# Patient Record
Sex: Female | Born: 1980 | Race: White | Hispanic: No | Marital: Married | Smoking: Never smoker
Health system: Southern US, Community
[De-identification: ages and names within clinical notes are randomized; demographics above are authoritative.]

## PROBLEM LIST (undated history)

## (undated) DIAGNOSIS — S92901A Unspecified fracture of right foot, initial encounter for closed fracture: Secondary | ICD-10-CM

## (undated) DIAGNOSIS — K5190 Ulcerative colitis, unspecified, without complications: Secondary | ICD-10-CM

## (undated) DIAGNOSIS — Z3493 Encounter for supervision of normal pregnancy, unspecified, third trimester: Secondary | ICD-10-CM

## (undated) DIAGNOSIS — D509 Iron deficiency anemia, unspecified: Secondary | ICD-10-CM

## (undated) DIAGNOSIS — E162 Hypoglycemia, unspecified: Secondary | ICD-10-CM

## (undated) HISTORY — PX: HERNIA REPAIR: SHX51

## (undated) HISTORY — DX: Iron deficiency anemia, unspecified: D50.9

---

## 2005-02-27 HISTORY — PX: ECTOPIC PREGNANCY SURGERY: SHX613

## 2013-06-04 ENCOUNTER — Encounter: Payer: Self-pay | Admitting: Obstetrics & Gynecology

## 2013-10-21 ENCOUNTER — Inpatient Hospital Stay: Payer: Self-pay

## 2013-10-21 LAB — CBC WITH DIFFERENTIAL/PLATELET
Basophil #: 0.1 10*3/uL (ref 0.0–0.1)
Basophil %: 0.5 %
Eosinophil #: 0 10*3/uL (ref 0.0–0.7)
Eosinophil %: 0.3 %
HCT: 35.6 % (ref 35.0–47.0)
HGB: 12 g/dL (ref 12.0–16.0)
Lymphocyte #: 1.7 10*3/uL (ref 1.0–3.6)
Lymphocyte %: 13.3 %
MCH: 30.7 pg (ref 26.0–34.0)
MCHC: 33.8 g/dL (ref 32.0–36.0)
MCV: 91 fL (ref 80–100)
MONOS PCT: 7.1 %
Monocyte #: 0.9 x10 3/mm (ref 0.2–0.9)
NEUTROS PCT: 78.8 %
Neutrophil #: 10.1 10*3/uL — ABNORMAL HIGH (ref 1.4–6.5)
Platelet: 202 10*3/uL (ref 150–440)
RBC: 3.91 10*6/uL (ref 3.80–5.20)
RDW: 13.5 % (ref 11.5–14.5)
WBC: 12.8 10*3/uL — AB (ref 3.6–11.0)

## 2013-10-22 LAB — HEMATOCRIT: HCT: 34 % — ABNORMAL LOW (ref 35.0–47.0)

## 2014-07-30 NOTE — L&D Delivery Note (Signed)
Obstetrical Delivery Note   Date of Delivery:   06/21/2015 Primary OB:   [facility] Gestational Age/EDD: 4459w6d  Antepartum complications: Too weak to titer Anti-E (capitalized)  Delivered By:   [place] Bingharlie Teylor Wolven, Montez HagemanJr MD  Delivery Type:   spontaneous vaginal delivery  Delivery Details:   CTSP and BBOW at +3 along with fetal head. With next UC, bag ruptured for clear fluid and patient easily delivered OA infant. Delayed cord clamping and cut by father. Anesthesia:    local Intrapartum complications: precipitous delivery GBS:    Negative Laceration:    1st degree repaired Episiotomy:    none Placenta:    Delivered and expressed via active management. Intact: yes. To pathology: no.  Estimated Blood Loss:  200mL  Baby:    Liveborn female, APGARs 8/9, weight 3370gm  Cornelia Copaharlie Terell Kincy, Jr. MD [facility] Pager [phone]

## 2014-11-19 NOTE — Consult Note (Signed)
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  Pt declines ever having a blood transfusion. All of her pregnancies have been by her husband.  Her first pregnancy was complicated by a heterotopic pregnancy which required a lapascopic removal of "pelvic' mass which was thought to be an ectopic. She is not sure if the pathology revealed pregnancy tissue. She reports having a negative antibody screen in her subsequent pregnancy.  During her prental screen, she was found to be sensitized to E with a titer of 1:8.  Repeat titers approximately 8 weeks later were down to 1:2. She had not had a prior affected pregnancy and is not aware of ever having a positive antibody screen.   Past Obstetrical Hx G3 P2002 2007: Possible heterotopic pregnancy. Found to have a "pelvic mass" early in pregnancy that was removed laparscopically. Pt thinks was an ectopic but is unsure what the pathology showed. She went on to have an uncomplicated prenatal course, going into labor spontaneously at 40 weeks, female infant weighing 6 pounds 11 ounces. No delivery complications 2013: Induced at 41 weeks, spontaneous vaginal delivery of 7 pound 11 ounce baby. No comlications. No antenatal bleeding   Home Medications: Medication Instructions Status  multivitamin 1   once a day Active   Allergies:   No Known Allergies:   Vital Signs/Notes:  Nursing Vital Signs: **Vital Signs.:   06-Nov-14 09:49  Pulse Pulse 72  Systolic BP Systolic BP 87  Diastolic BP (mmHg) Diastolic BP (mmHg) 62    09:54  Systolic BP Systolic BP 100  Diastolic BP (mmHg) Diastolic BP (mmHg) 65   Perinatal Consult:  PGyn Hx Denies history of abnormal paps or STDs   Past Medical History cont'd Denies history of transfusion, HTN, diabetes or  thyroid disorders   PSurg Hx Laparscopic "pelvic mass" removal in pregnancy, thought to be a heterotopic.  Umbilical hernia repair.   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 No abdominal pain. No vaginal bleeding.   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  Currently her titers have decreased from 1:8 to 1:2.   Recommendations 1. Isoimmunization to E.  We discussed the potential implications to the pregnancy being sensitized to E.  Anti-E can lead to hemolytic disease of the newborn. She denies ever having a prior positive antibody screen and denies having had a prior affected infant. All pregnancies have been with her husband. We discussed potential risk to the pregnancy, should her antibody level increases.  If her antibody titer levels increase to  1:16 or greater, we discussed fetal surveillance protocols to screen for fetal anemia and potential complications associated with PUBS and inuterine transfusion.  Recs: -Check antibody titers monthly for the entire pregnancy. If the titers get to 1:16 or higher, please send her back so we can test the father of the baby for carriage of the E antigen and then begin weekly MCA doppler  surveillance for fetal anemia. -At time of delivery, allow adequate time for her type and screen to be processed as the blood bank will need to find E-negative compatible blood.    Total Time Spent with Patient 60 minutes   >50% of visit spent in couseling/coordination of care yes   Office Use Only 99244  Level 4 (60min) NEW office consult low complexity   Coding Description: MATERNAL CONDITIONS/HISTORY INDICATION(S).   Isoimmunization, Non-RH (Anti E, Keli, M, S).  Electronic Signatures: Adriyana Greenbaum, [place] (MD)  (Signed [id number]-Nov-14 16:32)  Authored: Referral, Home Medications, Allergies, Vital Signs/Notes, Consult, Exam, Lab/Radiology Notes, Impression, Billing, Coding Description   Last Updated: 06-Nov-14 16:32 by Starla Deller, [place] (MD)

## 2015-06-21 ENCOUNTER — Inpatient Hospital Stay
Admission: RE | Admit: 2015-06-21 | Discharge: 2015-06-22 | DRG: 775 | Disposition: A | Discharge status: Home / Self Care | Payer: BLUE CROSS/BLUE SHIELD | Source: Ambulatory Visit | Attending: Obstetrics and Gynecology | Admitting: Obstetrics and Gynecology

## 2015-06-21 ENCOUNTER — Encounter: Payer: Self-pay | Admitting: *Deleted

## 2015-06-21 DIAGNOSIS — Z2821 Immunization not carried out because of patient refusal: Secondary | ICD-10-CM | POA: Diagnosis not present

## 2015-06-21 DIAGNOSIS — Z3483 Encounter for supervision of other normal pregnancy, third trimester: Secondary | ICD-10-CM | POA: Diagnosis present

## 2015-06-21 DIAGNOSIS — Z3A39 39 weeks gestation of pregnancy: Secondary | ICD-10-CM

## 2015-06-21 DIAGNOSIS — O0990 Supervision of high risk pregnancy, unspecified, unspecified trimester: Secondary | ICD-10-CM

## 2015-06-21 LAB — TYPE AND SCREEN
ABO/RH(D): O POS
ANTIBODY SCREEN: POSITIVE
PT AG TYPE: POSITIVE

## 2015-06-21 LAB — CBC
HCT: 38.5 % (ref 35.0–47.0)
HEMOGLOBIN: 12.6 g/dL (ref 12.0–16.0)
MCH: 29.4 pg (ref 26.0–34.0)
MCHC: 32.7 g/dL (ref 32.0–36.0)
MCV: 89.7 fL (ref 80.0–100.0)
Platelets: 231 10*3/uL (ref 150–440)
RBC: 4.3 MIL/uL (ref 3.80–5.20)
RDW: 13.3 % (ref 11.5–14.5)
WBC: 13.2 10*3/uL — AB (ref 3.6–11.0)

## 2015-06-21 LAB — ABO/RH: ABO/RH(D): O POS

## 2015-06-21 MED ORDER — LANOLIN HYDROUS EX OINT: TOPICAL_OINTMENT | CUTANEOUS | Status: DC | PRN

## 2015-06-21 MED ORDER — SIMETHICONE 80 MG PO CHEW: 80.0000 mg | CHEWABLE_TABLET | ORAL | Status: DC | PRN

## 2015-06-21 MED ORDER — BENZOCAINE-MENTHOL 20-0.5 % EX AERO
1.0000 | INHALATION_SPRAY | CUTANEOUS | Status: DC | PRN
Start: 2015-06-21 — End: 2015-06-22

## 2015-06-21 MED ORDER — OXYCODONE-ACETAMINOPHEN 5-325 MG PO TABS
1.0000 | ORAL_TABLET | Freq: Four times a day (QID) | ORAL | Status: DC | PRN
Start: 2015-06-21 — End: 2015-06-22

## 2015-06-21 MED ORDER — ONDANSETRON HCL 4 MG/2ML IJ SOLN: 4.0000 mg | Freq: Four times a day (QID) | INTRAMUSCULAR | Status: DC | PRN

## 2015-06-21 MED ORDER — AMMONIA AROMATIC IN INHA
RESPIRATORY_TRACT | Status: AC
  Filled 2015-06-21: qty 10

## 2015-06-21 MED ORDER — OXYTOCIN BOLUS FROM INFUSION: 500.0000 mL | INTRAVENOUS | Status: DC

## 2015-06-21 MED ORDER — TETANUS-DIPHTH-ACELL PERTUSSIS 5-2.5-18.5 LF-MCG/0.5 IM SUSP: 0.5000 mL | Freq: Once | INTRAMUSCULAR | Status: DC

## 2015-06-21 MED ORDER — OXYTOCIN 40 UNITS IN LACTATED RINGERS INFUSION - SIMPLE MED: 62.5000 mL/h | INTRAVENOUS | Status: DC | PRN

## 2015-06-21 MED ORDER — LIDOCAINE HCL (PF) 1 % IJ SOLN
INTRAMUSCULAR | Status: AC
  Filled 2015-06-21: qty 30

## 2015-06-21 MED ORDER — CITRIC ACID-SODIUM CITRATE 334-500 MG/5ML PO SOLN: 30.0000 mL | ORAL | Status: DC | PRN

## 2015-06-21 MED ORDER — INFLUENZA VAC SPLIT QUAD 0.5 ML IM SUSY: 0.5000 mL | PREFILLED_SYRINGE | INTRAMUSCULAR | Status: DC

## 2015-06-21 MED ORDER — OXYTOCIN 40 UNITS IN LACTATED RINGERS INFUSION - SIMPLE MED
INTRAVENOUS | Status: AC
  Administered 2015-06-21: 999 mL/h via INTRAVENOUS
  Filled 2015-06-21: qty 1000

## 2015-06-21 MED ORDER — MAGNESIUM HYDROXIDE 400 MG/5ML PO SUSP: 30.0000 mL | ORAL | Status: DC | PRN

## 2015-06-21 MED ORDER — WITCH HAZEL-GLYCERIN EX PADS: 1.0000 "application " | MEDICATED_PAD | CUTANEOUS | Status: DC | PRN

## 2015-06-21 MED ORDER — LIDOCAINE HCL (PF) 1 % IJ SOLN
30.0000 mL | INTRAMUSCULAR | Status: DC | PRN
  Filled 2015-06-21: qty 30

## 2015-06-21 MED ORDER — PRENATAL MULTIVITAMIN CH
1.0000 | ORAL_TABLET | Freq: Every day | ORAL | Status: DC
  Administered 2015-06-22: 1 via ORAL
  Filled 2015-06-21: qty 1

## 2015-06-21 MED ORDER — DOCUSATE SODIUM 100 MG PO CAPS
100.0000 mg | ORAL_CAPSULE | Freq: Two times a day (BID) | ORAL | Status: DC
  Administered 2015-06-21 – 2015-06-22 (×2): 100 mg via ORAL
  Filled 2015-06-21 (×2): qty 1

## 2015-06-21 MED ORDER — DIBUCAINE 1 % RE OINT: 1.0000 "application " | TOPICAL_OINTMENT | RECTAL | Status: DC | PRN

## 2015-06-21 MED ORDER — DIPHENHYDRAMINE HCL 25 MG PO CAPS: 25.0000 mg | ORAL_CAPSULE | Freq: Four times a day (QID) | ORAL | Status: DC | PRN

## 2015-06-21 MED ORDER — OXYTOCIN 40 UNITS IN LACTATED RINGERS INFUSION - SIMPLE MED
62.5000 mL/h | INTRAVENOUS | Status: DC
  Administered 2015-06-21: 999 mL/h via INTRAVENOUS

## 2015-06-21 MED ORDER — LACTATED RINGERS IV SOLN
INTRAVENOUS | Status: DC
  Administered 2015-06-21: 10:00:00 via INTRAVENOUS

## 2015-06-21 MED ORDER — LACTATED RINGERS IV SOLN
500.0000 mL | INTRAVENOUS | Status: DC | PRN
  Administered 2015-06-21: 1000 mL via INTRAVENOUS

## 2015-06-21 MED ORDER — IBUPROFEN 600 MG PO TABS
600.0000 mg | ORAL_TABLET | Freq: Four times a day (QID) | ORAL | Status: DC
  Administered 2015-06-21 – 2015-06-22 (×4): 600 mg via ORAL
  Filled 2015-06-21 (×4): qty 1

## 2015-06-21 MED ORDER — ACETAMINOPHEN 325 MG PO TABS: 650.0000 mg | ORAL_TABLET | ORAL | Status: DC | PRN

## 2015-06-21 MED ORDER — MISOPROSTOL 200 MCG PO TABS
ORAL_TABLET | ORAL | Status: AC
  Filled 2015-06-21: qty 4

## 2015-06-21 MED ORDER — OXYTOCIN 10 UNIT/ML IJ SOLN
INTRAMUSCULAR | Status: AC
  Filled 2015-06-21: qty 2

## 2015-06-21 NOTE — Discharge Summary (Signed)
Obstetrical Discharge Summary  Date of Admission: 06/21/2015 Date of Discharge: 06/22/2015  Primary OB: [facility]  Gestational Age at Delivery: 5581w6d   Antepartum complications: History of too weak to titer Anti-E maternal antibodies Reason for Admission: active labor Date of Delivery: 06/22/2015  Delivered By: Cornelia Copaharlie Pickens, Jr MD Delivery Type: spontaneous vaginal delivery Intrapartum complications/course: None Anesthesia: local Placenta: expressed. Intact: no. To pathology: no.  Laceration: 1st degree repaired Episiotomy: none Baby: Liveborn female, APGARs8/9, weight 3370 g.   Postpartum course: Uncomplicated vaginal delivery with benign postpartum course opting for 24-hr discahrge Discharge Vital Signs:  Current Vital Signs 24h Vital Sign Ranges  T 98.5 F (36.9 C) Temp  Avg: 98 F (36.7 C)  Min: 97.4 F (36.3 C)  Max: 98.5 F (36.9 C)  BP 109/76 mmHg BP  Min: 103/63  Max: 124/73  HR 64 Pulse  Avg: 74.5  Min: 62  Max: 100  RR 20 Resp  Avg: 18.7  Min: 18  Max: 20  SaO2 100 % Not Delivered SpO2  Avg: 98.5 %  Min: 97 %  Max: 100 %       24 Hour I/O Current Shift I/O  Time Ins Outs 11/22 0701 - 11/23 0700 In: 2812 [I.V.:2812] Out: 200       Discharge Exam:  NAD RRR no MRGs CTAB Abdomen: firm fundus below the umbilicus. Ext: no c/c/e  Disposition: Home  Rh Immune globulin given: not applicable Rubella vaccine given: not applicable Tdap vaccine given in AP or PP setting: declined Flu vaccine given in AP or PP setting: declined Contraception: undecided  Prenatal/Postnatal Panel: O pos//Rubella Immune//Varicella Immune//RPR negative//HIV negative/HepB Surface Ag negative//pap and hpv neg (date: 2015)//plans to breastfeed  Plan:  Monica Drake was discharged to home in good condition. Follow-up appointment with Dr. Vergie LivingPickens in 4 weeks  Discharge Medications:   Medication List    TAKE these medications        ibuprofen 600 MG tablet  Commonly known  as:  ADVIL,MOTRIN  Take 1 tablet (600 mg total) by mouth every 6 (six) hours.     prenatal vitamin w/FE, FA 29-1 MG Chew chewable tablet  Chew 1 tablet by mouth daily at 12 noon.

## 2015-06-21 NOTE — Discharge Instructions (Addendum)
Vaginal Delivery, Care After Refer to this sheet in the next few weeks. These discharge instructions provide you with information on caring for yourself after delivery. Your caregiver may also give you specific instructions. Your treatment has been planned according to the most current medical practices available, but problems sometimes occur. Call your caregiver if you have any problems or questions after you go home. HOME CARE INSTRUCTIONS  Take over-the-counter or prescription medicines only as directed by your caregiver or pharmacist.  Do not drink alcohol, especially if you are breastfeeding or taking medicine to relieve pain.  Do not smoke tobacco.  Continue to use good perineal care. Good perineal care includes:  Wiping your perineum from back to front  Keeping your perineum clean.  You can do sitz baths twice a day, to help keep this area clean  Do not use tampons, douche or have sex until your caregiver says it is okay.  Shower only and avoid sitting in submerged water, aside from sitz baths  Wear a well-fitting bra that provides breast support.  Eat healthy foods.  Drink enough fluids to keep your urine clear or pale yellow.  Eat high-fiber foods such as whole grain cereals and breads, brown rice, beans, and fresh fruits and vegetables every day. These foods may help prevent or relieve constipation.  Avoid constipation with high fiber foods or medications, such as miralax or metamucil  Follow your caregiver's recommendations regarding resumption of activities such as climbing stairs, driving, lifting, exercising, or traveling.  Talk to your caregiver about resuming sexual activities. Resumption of sexual activities is dependent upon your risk of infection, your rate of healing, and your comfort and desire to resume sexual activity.  Try to have someone help you with your household activities and your newborn for at least a few days after you leave the  hospital.  Rest as much as possible. Try to rest or take a nap when your newborn is sleeping.  Increase your activities gradually.  Keep all of your scheduled postpartum appointments. It is very important to keep your scheduled follow-up appointments. At these appointments, your caregiver will be checking to make sure that you are healing physically and emotionally. SEEK MEDICAL CARE IF:   You are passing large clots from your vagina. Save any clots to show your caregiver.  You have a foul smelling discharge from your vagina.  You have trouble urinating.  You are urinating frequently.  You have pain when you urinate.  You have a change in your bowel movements.  You have increasing redness, pain, or swelling near your vaginal incision (episiotomy) or vaginal tear.  You have pus draining from your episiotomy or vaginal tear.  Your episiotomy or vaginal tear is separating.  You have painful, hard, or reddened breasts.  You have a severe headache.  You have blurred vision or see spots.  You feel sad or depressed.  You have thoughts of hurting yourself or your newborn.  You have questions about your care, the care of your newborn, or medicines.  You are dizzy or light-headed.  You have a rash.  You have nausea or vomiting.  You were breastfeeding and have not had a menstrual period within 12 weeks after you stopped breastfeeding.  You are not breastfeeding and have not had a menstrual period by the 12th week after delivery.  You have a fever. SEEK IMMEDIATE MEDICAL CARE IF:   You have persistent pain.  You have chest pain.  You have shortness of breath.  You faint.  You have leg pain.  You have stomach pain.  Your vaginal bleeding saturates two or more sanitary pads in 1 hour. MAKE SURE YOU:   Understand these instructions.  Will watch your condition.  Will get help right away if you are not doing well or get worse. Document Released: 07/13/2000  Document Revised: 11/30/2013 Document Reviewed: 03/12/2012 [facility] Patient Information 2015 [employer] [place]. This information is not intended to replace advice given to you by your health care provider. Make sure you discuss any questions you have with your health care provider.  Sitz Bath A sitz bath is a warm water bath taken in the sitting position. The water covers only the hips and butt (buttocks). We recommend using one that fits in the toilet, to help with ease of use and cleanliness. It may be used for either healing or cleaning purposes. Sitz baths are also used to relieve pain, itching, or muscle tightening (spasms). The water may contain medicine. Moist heat will help you heal and relax.  HOME CARE  Take 3 to 4 sitz baths a day.  Fill the bathtub half-full with warm water.  Sit in the water and open the drain a little.  Turn on the warm water to keep the tub half-full. Keep the water running constantly.  Soak in the water for 15 to 20 minutes.  After the sitz bath, pat the affected area dry. GET HELP RIGHT AWAY IF: You get worse instead of better. Stop the sitz baths if you get worse. MAKE SURE YOU:  Understand these instructions.  Will watch your condition.  Will get help right away if you are not doing well or get worse. Document Released: 08/23/2004 Document Revised: 04/09/2012 Document Reviewed: 11/13/2010 [facility] Patient Information 2015 [place], [place]. This information is not intended to replace advice given to you by your health care provider. Make sure you discuss any questions you have with your health care provider.    Vaginal Delivery, Care After Refer to this sheet in the next few weeks. These discharge instructions provide you with information on caring for yourself after delivery. Your health care provider may also give you specific instructions. Your treatment has been planned according to the most current medical practices available, but problems  sometimes occur. Call your health care provider if you have any problems or questions after you go home. HOME CARE INSTRUCTIONS  Take over-the-counter or prescription medicines only as directed by your health care provider or pharmacist.  Do not drink alcohol, especially if you are breastfeeding or taking medicine to relieve pain.  Do not chew or smoke tobacco.  Do not use illegal drugs.  Continue to use good perineal care. Good perineal care includes:  Wiping your perineum from front to back.  Keeping your perineum clean.  Do not use tampons or douche until your health care provider says it is okay.  Shower, wash your hair, and take tub baths as directed by your health care provider.  Wear a well-fitting bra that provides breast support.  Eat healthy foods.  Drink enough fluids to keep your urine clear or pale yellow.  Eat high-fiber foods such as whole grain cereals and breads, brown rice, beans, and fresh fruits and vegetables every day. These foods may help prevent or relieve constipation.  Follow your health care provider's recommendations regarding resumption of activities such as climbing stairs, driving, lifting, exercising, or traveling.  Talk to your health care provider about resuming sexual activities. Resumption of sexual activities is  dependent upon your risk of infection, your rate of healing, and your comfort and desire to resume sexual activity.  Try to have someone help you with your household activities and your newborn for at least a few days after you leave the hospital.  Rest as much as possible. Try to rest or take a nap when your newborn is sleeping.  Increase your activities gradually.  Keep all of your scheduled postpartum appointments. It is very important to keep your scheduled follow-up appointments. At these appointments, your health care provider will be checking to make sure that you are healing physically and emotionally. SEEK MEDICAL CARE IF:    You are passing large clots from your vagina. Save any clots to show your health care provider.  You have a foul smelling discharge from your vagina.  You have trouble urinating.  You are urinating frequently.  You have pain when you urinate.  You have a change in your bowel movements.  You have increasing redness, pain, or swelling near your vaginal incision (episiotomy) or vaginal tear.  You have pus draining from your episiotomy or vaginal tear.  Your episiotomy or vaginal tear is separating.  You have painful, hard, or reddened breasts.  You have a severe headache.  You have blurred vision or see spots.  You feel sad or depressed.  You have thoughts of hurting yourself or your newborn.  You have questions about your care, the care of your newborn, or medicines.  You are dizzy or light-headed.  You have a rash.  You have nausea or vomiting.  You were breastfeeding and have not had a menstrual period within 12 weeks after you stopped breastfeeding.  You are not breastfeeding and have not had a menstrual period by the 12th week after delivery.  You have a fever. SEEK IMMEDIATE MEDICAL CARE IF:   You have persistent pain.  You have chest pain.  You have shortness of breath.  You faint.  You have leg pain.  You have stomach pain.  Your vaginal bleeding saturates two or more sanitary pads in 1 hour.   This information is not intended to replace advice given to you by your health care provider. Make sure you discuss any questions you have with your health care provider.   Document Released: 07/13/2000 Document Revised: 04/06/2015 Document Reviewed: 03/12/2012 Elsevier Interactive Patient Education [employer]2016 Elsevier Inc.

## 2015-06-21 NOTE — H&P (Signed)
Obstetrics Admission History & Physical  06/21/2015 - 10:27 AM Primary OBGYN: [facility]  Chief Complaint: regular contractions  History of Present Illness  34 y.o. [id number] @39 /6 (EDC 11/23, dated by 7wk u/s), with the above CC. Pregnancy complicated by: h/o anti-E antibodies (too weak this pregnancy), h/o UTI with negative TOC.  [name] states that no s/s of SROM, decreased FM.   Review of Systems: POtherwise, her 12 point review of systems is negative or as noted in the History of Present Illness.   PMHx: History reviewed. No pertinent past medical history. PSHx:  Past Surgical History  Procedure Laterality Date  . Hernia repair    . Laparoscopy  02/27/2005   Medications:  Prescriptions prior to admission  Medication Sig Dispense Refill Last Dose  . prenatal vitamin w/FE, FA (NATACHEW) 29-1 MG CHEW chewable tablet Chew 1 tablet by mouth daily at 12 noon.        Allergies: has No Known Allergies. OBHx: largest prior 7lbs 15oz 09/2013. All SVDs  GYNHx:  History of abnormal pap smears: No. History of STIs: No..             FHx:  Family History  Problem Relation Age of Onset  . Diabetes Mother   . Hypertension Mother   . Arthritis Mother   . Heart disease Mother   . Hypertension Father   . Arthritis Father   . Birth defects Sister    Soc Hx:  Social History   Social History  . Marital Status: Married    Spouse Name: N/A  . Number of Children: N/A  . Years of Education: N/A   Occupational History  . Not on file.   Social History Main Topics  . Smoking status: Never Smoker   . Smokeless tobacco: Not on file  . Alcohol Use: No  . Drug Use: No  . Sexual Activity: Yes    Birth Control/ Protection: Condom   Other Topics Concern  . Not on file   Social History Narrative  . No narrative on file    Objective    Current Vital Signs 24h Vital Sign Ranges  T 97.9 F (36.6 C) Temp  Avg: 97.9 F (36.6 C)  Min: 97.9 F (36.6 C)  Max: 97.9 F  (36.6 C)  BP 110/77 mmHg BP  Min: 110/77  Max: 114/76  HR 100 Pulse  Avg: 92.5  Min: 85  Max: 100  RR 18 Resp  Avg: 18  Min: 18  Max: 18  SaO2     No Data Recorded       24 Hour I/O Current Shift I/O  Time Ins Outs       EFM: 135 baseline +accels no decels mod variability  Toco: q3033m  General: Well nourished, well developed female in no acute distress.  Skin:  Warm and dry.  Cardiovascular: Regular rate and rhythm. Respiratory:  Clear to auscultation bilateral. Normal respiratory effort Abdomen: gravid, nttp Neuro/Psych:  Normal mood and affect.   SSE: deferred SVE: 5cm, intact per RN at 0800 Le[place]/EFW: 3500gm   Recent Labs Lab 06/21/15 0850  WBC 13.2*  HGB 12.6  HCT 38.5  PLT 231   ABO pending  Radiology none  Perinatal info  O pos/ Rubella  Immune / Varicella Immune/RPR pending/HIV Negative/HepB Surf Ag Negative/TDaP:declined / Flu shot: unknown/pap and hpv neg 2015/  Assessment & Plan  Active labor. Pt doing well *IUP: category I with accels *Labor: okay for epidural. Augment prn *GBS: neg *  Analgesia: epidural prn  Monica Copa MD [facility] Pager [phone]

## 2015-06-22 LAB — RPR: RPR: NONREACTIVE

## 2015-06-22 MED ORDER — IBUPROFEN 600 MG PO TABS: 600.0000 mg | ORAL_TABLET | Freq: Four times a day (QID) | ORAL | Status: DC

## 2015-06-22 NOTE — Progress Notes (Signed)
Lab called and reported a Anti Big E titer result of Negative

## 2015-06-22 NOTE — Progress Notes (Signed)
All discharge instructions completed with pt; pt discharged at this time via wheelchair and escorted by hospital volunteers; pt going home with husband and new baby

## 2015-06-30 LAB — ANTIBODY TITER: Ab Titer: NEGATIVE

## 2017-04-17 ENCOUNTER — Telehealth: Payer: Self-pay | Admitting: Advanced Practice Midwife

## 2017-04-17 NOTE — Telephone Encounter (Signed)
Pt is calling Harriett Sine back due to missed call. Please advise

## 2017-04-17 NOTE — Telephone Encounter (Signed)
I spoke to the patient who said she contacted the office to schedule a New OB appointment, and spoke to [place] who said she would need to talk with me about financial questions since she is uninsured and will be self pay. I suggested she contact [facility] for options, and offered to meet with her at her New OB appt to go over our self pay arrangement. Patient agreed.

## 2017-04-17 NOTE — Telephone Encounter (Signed)
I have not contacted this patient and do not see a telephone encounter from anyone else. Lmtrc.

## 2017-04-29 ENCOUNTER — Encounter: Payer: Self-pay | Admitting: Advanced Practice Midwife

## 2017-04-29 ENCOUNTER — Ambulatory Visit (INDEPENDENT_AMBULATORY_CARE_PROVIDER_SITE_OTHER): Payer: Self-pay | Admitting: Advanced Practice Midwife

## 2017-04-29 VITALS — BP 90/60 | Wt 123.0 lb

## 2017-04-29 DIAGNOSIS — Z113 Encounter for screening for infections with a predominantly sexual mode of transmission: Secondary | ICD-10-CM

## 2017-04-29 DIAGNOSIS — Z3480 Encounter for supervision of other normal pregnancy, unspecified trimester: Secondary | ICD-10-CM

## 2017-04-29 NOTE — Progress Notes (Signed)
New Obstetric Patient H&P    Chief Complaint: "Desires prenatal care"   History of Present Illness: Patient is a 36 y.o. [id number] Not Hispanic or Latino female, LMP 03/05/2017 presents with amenorrhea and positive home pregnancy test. Based on her  LMP, her EDD is Estimated Date of Delivery: 12/10/17 and her EGA is [redacted]w[redacted]d. Cycles are 5. days, regular, and occur approximately every : 28 days. Her last pap smear was 2 years ago and was no abnormalities.    She had a urine pregnancy test which was positive 3 week(s)  ago. Her last menstrual period was normal and lasted for  5 day(s). Since her LMP she claims she has experienced breast tenderness, fatigue, nausea. She denies vaginal bleeding. Her past medical history is noncontributory. Her prior pregnancies are notable for none  Since her LMP, she admits to the use of tobacco products  no She claims she has gained   no pounds since the start of her pregnancy.  There are cats in the home in the home  no  She admits close contact with children on a regular basis  yes  She has had chicken pox in the past yes She has had Tuberculosis exposures, symptoms, or previously tested positive for TB   no Current or past history of domestic violence. no  Genetic Screening/Teratology Counseling: (Includes patient, baby's father, or anyone in either family with:)   1. Patient's age >/= 80 at [facility]  no 2. Thalassemia ([place], [place], Mediterranean, or Asian background): MCV<80  no 3. Neural tube defect (meningomyelocele, spina bifida, anencephaly)  no 4. Congenital heart defect  no  5. Down syndrome  no 6. Tay-Sachs (Jewish, [place]  no 7. Canavan's Disease  no 8. Sickle cell disease or trait (African)  no  9. Hemophilia or other blood disorders  no  10. Muscular dystrophy  no  11. Cystic fibrosis  no  12. Huntington's Chorea  no  13. Mental retardation/autism  no 14. Other inherited genetic or chromosomal disorder  no 15. Maternal metabolic  disorder (DM, PKU, etc)  no 16. Patient or FOB with a child with a birth defect not listed above no  16a. Patient or FOB with a birth defect themselves no 17. Recurrent pregnancy loss, or stillbirth  no  18. Any medications since LMP other than prenatal vitamins (include vitamins, supplements, OTC meds, drugs, alcohol)  no 19. Any other genetic/environmental exposure to discuss  no  Infection History:   1. Lives with someone with TB or TB exposed  no  2. Patient or partner has history of genital herpes  no 3. Rash or viral illness since LMP  no 4. History of STI (GC, CT, HPV, syphilis, HIV)  no 5. History of recent travel :  no  Other pertinent information:  Considering BTL for contraception     Review of Systems:10 point review of systems negative unless otherwise noted in HPI  Past Medical History:  History reviewed. No pertinent past medical history.  Past Surgical History:  Past Surgical History:  Procedure Laterality Date  . ECTOPIC PREGNANCY SURGERY  02/27/2005  . HERNIA REPAIR      Gynecologic History: Patient's last menstrual period was 03/05/2017 (approximate).  Obstetric History: [id number]  Family History:  Family History  Problem Relation Age of Onset  . Diabetes Mother   . Hypertension Mother   . Arthritis Mother   . Heart disease Mother   . Hypertension Father   . Arthritis Father   . Birth  defects Sister     Social History:  Social History   Social History  . Marital status: Married    Spouse name: N/A  . Number of children: N/A  . Years of education: N/A   Occupational History  . Not on file.   Social History Main Topics  . Smoking status: Never Smoker  . Smokeless tobacco: Never Used  . Alcohol use No  . Drug use: No  . Sexual activity: Yes    Birth control/ protection: Condom   Other Topics Concern  . Not on file   Social History Narrative  . No narrative on file    Allergies:  No Known Allergies  Medications: Prior to  Admission medications   Medication Sig Start Date End Date Taking? Authorizing Provider  acetaminophen (TYLENOL) 325 MG tablet Take by mouth. 09/14/10  Yes [provider]  prenatal vitamin w/FE, FA (NATACHEW) 29-1 MG CHEW chewable tablet Chew 1 tablet by mouth daily at 12 noon.    [provider]    Physical Exam Vitals: Blood pressure 90/60, weight 123 lb (55.8 kg), last menstrual period 03/05/2017, unknown if currently breastfeeding.  General: NAD HEENT: normocephalic, anicteric Thyroid: no enlargement, no palpable nodules Pulmonary: No increased work of breathing, CTAB Cardiovascular: RRR, distal pulses 2+ Abdomen: NABS, soft, non-tender, non-distended.  Umbilicus without lesions.  No hepatomegaly, splenomegaly or masses palpable. No evidence of hernia  Genitourinary:  External: Normal external female genitalia.  Normal urethral meatus, normal  Bartholin's and Skene's glands.    Vagina: Normal vaginal mucosa, no evidence of prolapse.    Cervix: Grossly normal in appearance, no bleeding, no CMT  Uterus: Enlarged, mobile, normal contour.    Adnexa: ovaries non-enlarged, no adnexal masses  Rectal: deferred Extremities: no edema, erythema, or tenderness Neurologic: Grossly intact Psychiatric: mood appropriate, affect full   Assessment: 36 y.o. [id number] at [redacted]w[redacted]d presenting to initiate prenatal care  Plan: 1) Avoid alcoholic beverages. 2) Patient encouraged not to smoke.  3) Discontinue the use of all non-medicinal drugs and chemicals.  4) Take prenatal vitamins daily.  5) Nutrition, food safety (fish, cheese advisories, and high nitrite foods) and exercise discussed. 6) Hospital and practice style discussed with cross coverage system.  7) Genetic Screening, such as with 1st Trimester Screening, cell free fetal DNA, AFP testing, and Ultrasound, as well as with amniocentesis and CVS as appropriate, is discussed with patient. At the conclusion of today's visit patient  declined genetic testing 8) Patient is asked about travel to areas at risk for the [place] virus, and counseled to avoid travel and exposure to mosquitoes or sexual partners who may have themselves been exposed to the virus. Testing is discussed, and will be ordered as appropriate.   Tresea Mall, CNM

## 2017-04-29 NOTE — Patient Instructions (Signed)

## 2017-05-03 LAB — GC/CHLAMYDIA PROBE AMP
Chlamydia trachomatis, NAA: NEGATIVE
NEISSERIA GONORRHOEAE BY PCR: NEGATIVE

## 2017-05-03 LAB — URINE CULTURE: Organism ID, Bacteria: NO GROWTH

## 2017-05-13 ENCOUNTER — Ambulatory Visit (INDEPENDENT_AMBULATORY_CARE_PROVIDER_SITE_OTHER): Payer: Medicaid Other

## 2017-05-13 ENCOUNTER — Ambulatory Visit (INDEPENDENT_AMBULATORY_CARE_PROVIDER_SITE_OTHER): Payer: Self-pay | Admitting: Advanced Practice Midwife

## 2017-05-13 VITALS — BP 104/64 | Wt 126.0 lb

## 2017-05-13 DIAGNOSIS — Z3480 Encounter for supervision of other normal pregnancy, unspecified trimester: Secondary | ICD-10-CM | POA: Diagnosis not present

## 2017-05-13 DIAGNOSIS — Z3A09 9 weeks gestation of pregnancy: Secondary | ICD-10-CM

## 2017-05-13 DIAGNOSIS — Z362 Encounter for other antenatal screening follow-up: Secondary | ICD-10-CM

## 2017-05-13 NOTE — Progress Notes (Signed)
  Routine Prenatal Care Visit  Subjective  Monica Drake is a 36 y.o. [phone] at [redacted]w[redacted]d being seen today for ongoing prenatal care.  She is currently monitored for the following issues for this low-risk pregnancy and has Supervision of other normal pregnancy, antepartum on her problem list.  ----------------------------------------------------------------------------------- Patient reports restless leg and wonders what she can take.    . Vag. Bleeding: None.   . Denies leaking of fluid.  ----------------------------------------------------------------------------------- The following portions of the patient's history were reviewed and updated as appropriate: allergies, current medications, past family history, past medical history, past social history, past surgical history and problem list. Problem list updated.   Objective  Blood pressure 104/64, weight 126 lb (57.2 kg), last menstrual period 03/05/2017 Pregravid weight 123 pounds Total Weight Gain 3 pounds Urinalysis:      Fetal Status:  Dating scan today agrees with LMP. [redacted]w[redacted]d by u/s today, no adjustment of EDD         General:  Alert, oriented and cooperative. Patient is in no acute distress.  Skin: Skin is warm and dry. No rash noted.   Cardiovascular: Normal heart rate noted  Respiratory: Normal respiratory effort, no problems with respiration noted  Abdomen: Soft, gravid, appropriate for gestational age. Pain/Pressure: Absent     Pelvic:  Cervical exam deferred        Extremities: Normal range of motion.     Mental Status: Normal mood and affect. Normal behavior. Normal judgment and thought content.   Assessment   36 y.o. [id number] at [redacted]w[redacted]d by  12/10/2017, by Last Menstrual Period presenting for routine prenatal visit  Plan   pregnancy5 Problems (from 03/05/17 to present)    No problems associated with this episode.       Preterm labor symptoms and general obstetric precautions including but not limited to vaginal  bleeding, contractions, leaking of fluid and fetal movement were reviewed in detail with the patient.  Magnesium supplement/epsom salt bath for restless leg   Return in about 4 weeks (around 06/10/2017) for rob.  Tresea Mall, CNM  05/13/2017 10:33 AM

## 2017-05-13 NOTE — Progress Notes (Signed)
No vb. No lof.  

## 2017-06-10 ENCOUNTER — Encounter: Payer: Self-pay | Admitting: Advanced Practice Midwife

## 2017-06-10 ENCOUNTER — Ambulatory Visit (INDEPENDENT_AMBULATORY_CARE_PROVIDER_SITE_OTHER): Payer: Self-pay | Admitting: Advanced Practice Midwife

## 2017-06-10 VITALS — BP 110/60 | Wt 130.0 lb

## 2017-06-10 DIAGNOSIS — Z3A13 13 weeks gestation of pregnancy: Secondary | ICD-10-CM

## 2017-06-10 NOTE — Progress Notes (Signed)
Constipation, no vb. No lof.

## 2017-06-10 NOTE — Patient Instructions (Signed)

## 2017-06-10 NOTE — Progress Notes (Signed)
  Routine Prenatal Care Visit  Subjective  Monica Drake is a 36 y.o. [phone] at 1375w6d being seen today for ongoing prenatal care.  She is currently monitored for the following issues for this low-risk pregnancy and has Supervision of other normal pregnancy, antepartum on their problem list.  ----------------------------------------------------------------------------------- Patient reports constipation.    . Vag. Bleeding: None.   . Denies leaking of fluid.  ----------------------------------------------------------------------------------- The following portions of the patient's history were reviewed and updated as appropriate: allergies, current medications, past family history, past medical history, past social history, past surgical history and problem list. Problem list updated.   Objective  Blood pressure 110/60, weight 130 lb (59 kg), last menstrual period 03/05/2017, unknown if currently breastfeeding. Pregravid weight 123 lb (55.8 kg) Total Weight Gain 7 lb (3.175 kg) Urinalysis:      Fetal Status:           General:  Alert, oriented and cooperative. Patient is in no acute distress.  Skin: Skin is warm and dry. No rash noted.   Cardiovascular: Normal heart rate noted  Respiratory: Normal respiratory effort, no problems with respiration noted  Abdomen: Soft, gravid, appropriate for gestational age. Pain/Pressure: Absent     Pelvic:  Cervical exam deferred        Extremities: Normal range of motion.     Mental Status: Normal mood and affect. Normal behavior. Normal judgment and thought content.   Assessment   36 y.o. [id number] at 6675w6d by  12/10/2017, by Last Menstrual Period presenting for routine prenatal visit  Plan   pregnancy5 Problems (from 03/05/17 to present)    No problems associated with this episode.       Preterm labor symptoms and general obstetric precautions including but not limited to vaginal bleeding, contractions, leaking of fluid and fetal movement  were reviewed in detail with the patient.  Increase h2o, fiber, activity for relief of constipation. Sample of Ferralet with stool softener given.  Please refer to After Visit Summary for other counseling recommendations.   Return in about 4 weeks (around 07/08/2017) for rob.  Tresea MallJane Tyquasia Pant, CNM  06/10/2017 9:44 AM

## 2017-07-01 ENCOUNTER — Encounter: Payer: Self-pay | Admitting: Obstetrics & Gynecology

## 2017-07-08 ENCOUNTER — Encounter: Payer: Self-pay | Admitting: Maternal Newborn

## 2017-07-15 ENCOUNTER — Ambulatory Visit (INDEPENDENT_AMBULATORY_CARE_PROVIDER_SITE_OTHER): Payer: Medicaid Other | Admitting: Obstetrics and Gynecology

## 2017-07-15 ENCOUNTER — Encounter: Payer: Self-pay | Admitting: Obstetrics and Gynecology

## 2017-07-15 ENCOUNTER — Telehealth: Payer: Self-pay

## 2017-07-15 VITALS — BP 98/58 | Wt 134.0 lb

## 2017-07-15 DIAGNOSIS — K640 First degree hemorrhoids: Secondary | ICD-10-CM | POA: Insufficient documentation

## 2017-07-15 DIAGNOSIS — Z3A18 18 weeks gestation of pregnancy: Secondary | ICD-10-CM

## 2017-07-15 DIAGNOSIS — O0992 Supervision of high risk pregnancy, unspecified, second trimester: Secondary | ICD-10-CM

## 2017-07-15 DIAGNOSIS — O4692 Antepartum hemorrhage, unspecified, second trimester: Secondary | ICD-10-CM

## 2017-07-15 DIAGNOSIS — Z3480 Encounter for supervision of other normal pregnancy, unspecified trimester: Secondary | ICD-10-CM

## 2017-07-15 DIAGNOSIS — Z124 Encounter for screening for malignant neoplasm of cervix: Secondary | ICD-10-CM

## 2017-07-15 MED ORDER — HYDROCORTISONE ACETATE 25 MG RE SUPP: 25.0000 mg | Freq: Two times a day (BID) | RECTAL | 1 refills | Status: DC

## 2017-07-15 NOTE — Progress Notes (Signed)
    Routine Prenatal Care Visit  Subjective  Monica Drake is a 36 y.o. [phone] at 4344w6d being seen today for ongoing prenatal care.  She is currently monitored for the following issues for this high-risk pregnancy and has High-risk pregnancy and Grade I hemorrhoids on their problem list.  ----------------------------------------------------------------------------------- Patient reports small vaginal bleeding with bowel movements. Not improved despite usage of over the counter cream. Insurance is now active.     . Vag. Bleeding: None.   . Denies leaking of fluid.  ----------------------------------------------------------------------------------- The following portions of the patient's history were reviewed and updated as appropriate: allergies, current medications, past family history, past medical history, past social history, past surgical history and problem list. Problem list updated.   Objective  Blood pressure (!) 98/58, weight 134 lb (60.8 kg), last menstrual period 03/05/2017, unknown if currently breastfeeding. Pregravid weight 123 lb (55.8 kg) Total Weight Gain 11 lb (4.99 kg) Urinalysis: Urine Protein: Negative Urine Glucose: Negative  Fetal Status: Fetal Heart Rate (bpm): 145         General:  Alert, oriented and cooperative. Patient is in no acute distress.  Skin: Skin is warm and dry. No rash noted.   Cardiovascular: Normal heart rate noted  Respiratory: Normal respiratory effort, no problems with respiration noted  Abdomen: Soft, gravid, appropriate for gestational age. Pain/Pressure: Absent     Pelvic:  Cervical exam deferred      Pap collected. Cervix grossly normal. No external hemorrhoids presents.     Extremities: Normal range of motion.     ental Status: Normal mood and affect. Normal behavior. Normal judgment and thought content.     Assessment   36 y.o. [id number] at 2744w6d by  12/10/2017, by Last Menstrual Period presenting for routine prenatal visit  Plan     pregnancy5 Problems (from 03/05/17 to present)    Problem Noted Resolved   High-risk pregnancy 06/21/2015 by [place] BingPickens, Charlie, MD No   Overview Addendum 07/15/2017 11:07 AM by Natale MilchSchuman, Dajha Urquilla R, MD    Clinic [facility] Prenatal Labs  Dating  Blood type:     Genetic Screen Declines all genetic screening Antibody:   Anatomic US  Rubella: @RUBELLARESULTSCONSOLE @ Varicella:    GTT Early: NA, Third trimester:  RPR:     Rhogam  HBsAg:     TDaP vaccine              Flu Shot:Declines HIV:     Baby Food Breast                               GBS:   Contraception Declines Pap:  CBB     CS/VBAC NA   Support Person Alaric Vanorder                 Preterm labor symptoms and general obstetric precautions including but not limited to vaginal bleeding, contractions, leaking of fluid and fetal movement were reviewed in detail with the patient. Please refer to After Visit Summary for other counseling recommendations.   Prenatal labs drawn today Pap smear performed. Prescription for hemorrhoid cream sent. Return in about 2 weeks (around 07/29/2017) for ROB and anatomy US.

## 2017-07-15 NOTE — Progress Notes (Signed)
Pt c/o ankles starting to swell and pain and discomfort with bowels. Also has internal hemorrhoids that are bleeding.

## 2017-07-15 NOTE — Telephone Encounter (Signed)
Suppositories not covered by medicaid. Please advise. Thank you.

## 2017-07-15 NOTE — Telephone Encounter (Signed)
Hydrocortisone cream not covered by [employer].  Is there something cheaper?  [phone]

## 2017-07-16 NOTE — Telephone Encounter (Signed)
Patient can use goodrx application to get a coupon for the suppositories. Several pharmacies listed on the app sell the medication for $61.28. Could send rx to [facility] for anucort hc and that would be $57.09 with a coupon from that application.  Looked but did not find cheaper option.  Tried to call patient but no answer and voicemail not set up.

## 2017-07-16 NOTE — Telephone Encounter (Signed)
Pt returning call regarding Rx from yesterday. [phone]

## 2017-07-16 NOTE — Telephone Encounter (Signed)
Tried to call patient, voicemail not set up

## 2017-07-16 NOTE — Telephone Encounter (Signed)
Pt aware and plans to use coupon

## 2017-07-17 LAB — RPR+RH+ABO+RUB AB+AB SCR+CB...
HEMOGLOBIN: 10.2 g/dL — AB (ref 11.1–15.9)
HEP B S AG: NEGATIVE
HIV Screen 4th Generation wRfx: NONREACTIVE
Hematocrit: 31.3 % — ABNORMAL LOW (ref 34.0–46.6)
MCH: 30.1 pg (ref 26.6–33.0)
MCHC: 32.6 g/dL (ref 31.5–35.7)
MCV: 92 fL (ref 79–97)
Platelets: 230 10*3/uL (ref 150–379)
RBC: 3.39 x10E6/uL — AB (ref 3.77–5.28)
RDW: 14.1 % (ref 12.3–15.4)
RPR: NONREACTIVE
RUBELLA: 2.27 {index} (ref >0.99)
Rh Factor: POSITIVE
VARICELLA: 1391 {index} (ref >165)
WBC: 5.6 10*3/uL (ref 3.4–10.8)

## 2017-07-17 LAB — PAPIG, HPV, RFX 16/18
HPV, HIGH-RISK: NEGATIVE
PAP SMEAR COMMENT: 0

## 2017-07-17 LAB — AB SCR+ANTIBODY ID
Antibody Screen: POSITIVE — AB
Coombs Titer #1: 1

## 2017-07-29 ENCOUNTER — Ambulatory Visit (INDEPENDENT_AMBULATORY_CARE_PROVIDER_SITE_OTHER): Payer: Medicaid Other

## 2017-07-29 ENCOUNTER — Ambulatory Visit (INDEPENDENT_AMBULATORY_CARE_PROVIDER_SITE_OTHER): Payer: Medicaid Other | Admitting: Obstetrics and Gynecology

## 2017-07-29 VITALS — BP 104/68 | Wt 137.0 lb

## 2017-07-29 DIAGNOSIS — O360990 Maternal care for other rhesus isoimmunization, unspecified trimester, not applicable or unspecified: Secondary | ICD-10-CM | POA: Insufficient documentation

## 2017-07-29 DIAGNOSIS — Z3A20 20 weeks gestation of pregnancy: Secondary | ICD-10-CM

## 2017-07-29 DIAGNOSIS — Z362 Encounter for other antenatal screening follow-up: Secondary | ICD-10-CM | POA: Diagnosis not present

## 2017-07-29 DIAGNOSIS — Z3480 Encounter for supervision of other normal pregnancy, unspecified trimester: Secondary | ICD-10-CM

## 2017-07-29 DIAGNOSIS — O0992 Supervision of high risk pregnancy, unspecified, second trimester: Secondary | ICD-10-CM

## 2017-07-29 NOTE — Progress Notes (Signed)
ROB Anatomy scan/DO NOT TELL SEX!!

## 2017-07-29 NOTE — Progress Notes (Signed)
Routine Prenatal Care Visit  Subjective  Monica Drake is a 36 y.o. [phone] at 3715w6d being seen today for ongoing prenatal care.  She is currently monitored for the following issues for this high-risk pregnancy and has High-risk pregnancy; Grade I hemorrhoids; and Anti-E isoimmunization affecting pregnancy, antepartum on their problem list.  ----------------------------------------------------------------------------------- Patient reports no complaints.   Contractions: Not present. Vag. Bleeding: None.  Movement: Present. Denies leaking of fluid.  ----------------------------------------------------------------------------------- The following portions of the patient's history were reviewed and updated as appropriate: allergies, current medications, past family history, past medical history, past social history, past surgical history and problem list. Problem list updated.   Objective  Blood pressure 104/68, weight 137 lb (62.1 kg), last menstrual period 03/05/2017, unknown if currently breastfeeding. Pregravid weight 123 lb (55.8 kg) Total Weight Gain 14 lb (6.35 kg) Urinalysis: Urine Protein: Negative Urine Glucose: Negative  Fetal Status: Fetal Heart Rate (bpm): 150   Movement: Present     General:  Alert, oriented and cooperative. Patient is in no acute distress.  Skin: Skin is warm and dry. No rash noted.   Cardiovascular: Normal heart rate noted  Respiratory: Normal respiratory effort, no problems with respiration noted  Abdomen: Soft, gravid, appropriate for gestational age. Pain/Pressure: Absent     Pelvic:  Cervical exam deferred        Extremities: Normal range of motion.     ental Status: Normal mood and affect. Normal behavior. Normal judgment and thought content.     Assessment   36 y.o. [id number] at 9115w6d by  12/10/2017, by Last Menstrual Period presenting for routine prenatal visit  Plan   pregnancy5 Problems (from 03/05/17 to present)    Problem Noted  Resolved   Anti-E isoimmunization affecting pregnancy, antepartum 07/29/2017 by Vena AustriaStaebler, Quincy Prisco, MD No   Overview Addendum 07/29/2017  4:52 PM by Vena AustriaStaebler, Tayjon Halladay, MD    Anti-E antibody on initial T&S Reportedly posistive since her G2 pregnancy, no history of blood transfusions.  Monitor serial T&S if titer rises >1:16 MCA doppler and DP referral      High-risk pregnancy 06/21/2015 by [place] BingPickens, Charlie, MD No   Overview Addendum 07/29/2017  4:55 PM by Vena AustriaStaebler, Catia Todorov, MD    [facility] Prenatal Labs  Dating LMP = 10 week US Blood type: O/Positive/-- (12/17 1059)   Genetic Screen Declines all genetic screening Antibody:Positive, See Final Results (12/17 1059) Anti-E  Anatomic US Complete 07/29/17 Rubella: Immune Varicella: Immune  GTT Early: NA, Third trimester:  RPR: Non Reactive (12/17 1059)   Rhogam N/A HBsAg: Negative (12/17 1059)   TDaP vaccine              Flu Shot:Declines HIV: Non Reactive (12/17 1059)   Baby Food Breast                               GBS:   Contraception Declines Pap: 07/15/17 NIL HPV negative  CBB     CS/VBAC NA   Support Person Alaric Zingg                Preterm labor symptoms and general obstetric precautions including but not limited to vaginal bleeding, contractions, leaking of fluid and fetal movement were reviewed in detail with the patient. Please refer to After Visit Summary for other counseling recommendations.  - Discussed anti-E, was present since G2 and no complications.  Will follow serial titers, if rise above 1:16 DP referral  to monitor MCA dopplers for fetal anemia - Anatomy scan complete today - Also some rectal bleeding started on Anusol by Dr. Jerene PitchSchuman, if does not resolve consider GI referral.  Discussed unlikely for UC or Crohn's to present in pregnancy but has gone from constipation to loose stools in the past week  Return in about 4 weeks (around 08/26/2017) for ROB.

## 2017-07-30 NOTE — L&D Delivery Note (Signed)
Delivery Note At 2:31 AM a viable female was delivered via Vaginal, Spontaneously by nursing. Patient had a sudden urge to push after making rapid change and delivered with nurse in room. I was called at 02:29 at home and responded to the hospital within 10 minutes. When I arrived Mom was resting comfortably with infant skin to skin. Placenta was still inside. I delivered the placenta with gentle tension. Placenta was spontaneous and intact with a three vessel cord. Repair of a second degree vaginal laceration with 3-0 vicryl suture under 5cc of local lidocaine.  APGAR: 8, 9;  Weight pending at time of this note.   Anesthesia:  Local Episiotomy: None Lacerations: 2nd degree Suture Repair: 3.0 vicryl Est. Blood Loss (mL): 905  Mom to postpartum.  Baby to Couplet care / Skin to Skin.  Monica Drake R Monica Drake 11/22/2017, 3:21 AM

## 2017-07-31 LAB — AB SCR+ANTIBODY ID: ANTIBODY SCREEN: POSITIVE — AB

## 2017-07-31 LAB — ANTIBODY SCREEN

## 2017-07-31 LAB — ABO

## 2017-08-26 ENCOUNTER — Ambulatory Visit (INDEPENDENT_AMBULATORY_CARE_PROVIDER_SITE_OTHER): Payer: Medicaid Other | Admitting: Obstetrics & Gynecology

## 2017-08-26 VITALS — BP 100/60 | Wt 139.0 lb

## 2017-08-26 DIAGNOSIS — Z3A24 24 weeks gestation of pregnancy: Secondary | ICD-10-CM

## 2017-08-26 DIAGNOSIS — O0992 Supervision of high risk pregnancy, unspecified, second trimester: Secondary | ICD-10-CM

## 2017-08-26 DIAGNOSIS — O360990 Maternal care for other rhesus isoimmunization, unspecified trimester, not applicable or unspecified: Secondary | ICD-10-CM

## 2017-08-26 NOTE — Patient Instructions (Signed)

## 2017-08-26 NOTE — Progress Notes (Signed)
  Subjective  Fetal Movement? yes Contractions? no Leaking Fluid? no Vaginal Bleeding? no  Objective  BP 100/60   Wt 139 lb (63 kg)   LMP 03/05/2017 (Approximate)   BMI 22.44 kg/m  General: NAD Pumonary: no increased work of breathing Abdomen: gravid, non-tender Extremities: no edema Psychiatric: mood appropriate, affect full  Assessment  37 y.o. [id number] at 6855w6d by  12/10/2017, by Last Menstrual Period presenting for routine prenatal visit  Plan   Problem List Items Addressed This Visit      Other   High-risk pregnancy   Anti-E isoimmunization affecting pregnancy, antepartum    Other Visit Diagnoses    [redacted] weeks gestation of pregnancy    -  Primary   Relevant Orders   28 Week RH+Panel     [facility] Prenatal Labs  Dating LMP = 10 week US Blood type: O/Positive/-- (12/17 1059)   Genetic Screen Declines all genetic screening Antibody:Positive, See Final Results (12/17 1059) Anti-E  Anatomic US Complete 07/29/17 Rubella: Immune Varicella: Immune  GTT Early: NA, Third trimester:  RPR: Non Reactive (12/17 1059)   Rhogam N/A HBsAg: Negative (12/17 1059)   TDaP vaccine planned  Flu Shot:Declines HIV: Non Reactive (12/17 1059)   Baby Food Breast                               GBS:   Contraception Declines Pap: 07/15/17 NIL HPV negative  CBB     CS/VBAC NA   Support Person Alaric Helling   PNV. Glucola nv.  Annamarie MajorPaul Hagen Tidd, MD, Merlinda FrederickFACOG [facility], [facility] 08/26/2017  9:28 AM

## 2017-09-23 ENCOUNTER — Other Ambulatory Visit: Payer: Medicaid Other

## 2017-09-23 ENCOUNTER — Ambulatory Visit (INDEPENDENT_AMBULATORY_CARE_PROVIDER_SITE_OTHER): Payer: Medicaid Other | Admitting: Obstetrics & Gynecology

## 2017-09-23 VITALS — BP 100/60 | Wt 139.0 lb

## 2017-09-23 DIAGNOSIS — K921 Melena: Secondary | ICD-10-CM

## 2017-09-23 DIAGNOSIS — Z3A28 28 weeks gestation of pregnancy: Secondary | ICD-10-CM

## 2017-09-23 DIAGNOSIS — O0993 Supervision of high risk pregnancy, unspecified, third trimester: Secondary | ICD-10-CM

## 2017-09-23 NOTE — Patient Instructions (Signed)
Third Trimester of Pregnancy The third trimester is from week 28 through week 40 (months 7 through 9). The third trimester is a time when the unborn baby (fetus) is growing rapidly. At the end of the ninth month, the fetus is about 20 inches in length and weighs 6-10 pounds. Body changes during your third trimester Your body will continue to go through many changes during pregnancy. The changes vary from woman to woman. During the third trimester:  Your weight will continue to increase. You can expect to gain 25-35 pounds (11-16 kg) by the end of the pregnancy.  You may begin to get stretch marks on your hips, abdomen, and breasts.  You may urinate more often because the fetus is moving lower into your pelvis and pressing on your bladder.  You may develop or continue to have heartburn. This is caused by increased hormones that slow down muscles in the digestive tract.  You may develop or continue to have constipation because increased hormones slow digestion and cause the muscles that push waste through your intestines to relax.  You may develop hemorrhoids. These are swollen veins (varicose veins) in the rectum that can itch or be painful.  You may develop swollen, bulging veins (varicose veins) in your legs.  You may have increased body aches in the pelvis, back, or thighs. This is due to weight gain and increased hormones that are relaxing your joints.  You may have changes in your hair. These can include thickening of your hair, rapid growth, and changes in texture. Some women also have hair loss during or after pregnancy, or hair that feels dry or thin. Your hair will most likely return to normal after your baby is born.  Your breasts will continue to grow and they will continue to become tender. A yellow fluid (colostrum) may leak from your breasts. This is the first milk you are producing for your baby.  Your belly button may stick out.  You may notice more swelling in your hands,  face, or ankles.  You may have increased tingling or numbness in your hands, arms, and legs. The skin on your belly may also feel numb.  You may feel short of breath because of your expanding uterus.  You may have more problems sleeping. This can be caused by the size of your belly, increased need to urinate, and an increase in your body's metabolism.  You may notice the fetus "dropping," or moving lower in your abdomen (lightening).  You may have increased vaginal discharge.  You may notice your joints feel loose and you may have pain around your pelvic bone.  What to expect at prenatal visits You will have prenatal exams every 2 weeks until week 36. Then you will have weekly prenatal exams. During a routine prenatal visit:  You will be weighed to make sure you and the baby are growing normally.  Your blood pressure will be taken.  Your abdomen will be measured to track your baby's growth.  The fetal heartbeat will be listened to.  Any test results from the previous visit will be discussed.  You may have a cervical check near your due date to see if your cervix has softened or thinned (effaced).  You will be tested for Group B streptococcus. This happens between 35 and 37 weeks.  Your health care provider may ask you:  What your birth plan is.  How you are feeling.  If you are feeling the baby move.  If you have had   any abnormal symptoms, such as leaking fluid, bleeding, severe headaches, or abdominal cramping.  If you are using any tobacco products, including cigarettes, chewing tobacco, and electronic cigarettes.  If you have any questions.  Other tests or screenings that may be performed during your third trimester include:  Blood tests that check for low iron levels (anemia).  Fetal testing to check the health, activity level, and growth of the fetus. Testing is done if you have certain medical conditions or if there are problems during the  pregnancy.  Nonstress test (NST). This test checks the health of your baby to make sure there are no signs of problems, such as the baby not getting enough oxygen. During this test, a belt is placed around your belly. The baby is made to move, and its heart rate is monitored during movement.  What is false labor? False labor is a condition in which you feel small, irregular tightenings of the muscles in the womb (contractions) that usually go away with rest, changing position, or drinking water. These are called Braxton Hicks contractions. Contractions may last for hours, days, or even weeks before true labor sets in. If contractions come at regular intervals, become more frequent, increase in intensity, or become painful, you should see your health care provider. What are the signs of labor?  Abdominal cramps.  Regular contractions that start at 10 minutes apart and become stronger and more frequent with time.  Contractions that start on the top of the uterus and spread down to the lower abdomen and back.  Increased pelvic pressure and dull back pain.  A watery or bloody mucus discharge that comes from the vagina.  Leaking of amniotic fluid. This is also known as your "water breaking." It could be a slow trickle or a gush. Let your health care provider know if it has a color or strange odor. If you have any of these signs, call your health care provider right away, even if it is before your due date. Follow these instructions at home: Medicines  Follow your health care provider's instructions regarding medicine use. Specific medicines may be either safe or unsafe to take during pregnancy.  Take a prenatal vitamin that contains at least 600 micrograms (mcg) of folic acid.  If you develop constipation, try taking a stool softener if your health care provider approves. Eating and drinking  Eat a balanced diet that includes fresh fruits and vegetables, whole grains, good sources of protein  such as meat, eggs, or tofu, and low-fat dairy. Your health care provider will help you determine the amount of weight gain that is right for you.  Avoid raw meat and uncooked cheese. These carry germs that can cause birth defects in the baby.  If you have low calcium intake from food, talk to your health care provider about whether you should take a daily calcium supplement.  Eat four or five small meals rather than three large meals a day.  Limit foods that are high in fat and processed sugars, such as fried and sweet foods.  To prevent constipation: ? Drink enough fluid to keep your urine clear or pale yellow. ? Eat foods that are high in fiber, such as fresh fruits and vegetables, whole grains, and beans. Activity  Exercise only as directed by your health care provider. Most women can continue their usual exercise routine during pregnancy. Try to exercise for 30 minutes at least 5 days a week. Stop exercising if you experience uterine contractions.  Avoid heavy   lifting.  Do not exercise in extreme heat or humidity, or at high altitudes.  Wear low-heel, comfortable shoes.  Practice good posture.  You may continue to have sex unless your health care provider tells you otherwise. Relieving pain and discomfort  Take frequent breaks and rest with your legs elevated if you have leg cramps or low back pain.  Take warm sitz baths to soothe any pain or discomfort caused by hemorrhoids. Use hemorrhoid cream if your health care provider approves.  Wear a good support bra to prevent discomfort from breast tenderness.  If you develop varicose veins: ? Wear support pantyhose or compression stockings as told by your healthcare provider. ? Elevate your feet for 15 minutes, 3-4 times a day. Prenatal care  Write down your questions. Take them to your prenatal visits.  Keep all your prenatal visits as told by your health care provider. This is important. Safety  Wear your seat belt at  all times when driving.  Make a list of emergency phone numbers, including numbers for family, friends, the hospital, and police and fire departments. General instructions  Avoid cat litter boxes and soil used by cats. These carry germs that can cause birth defects in the baby. If you have a cat, ask someone to clean the litter box for you.  Do not travel far distances unless it is absolutely necessary and only with the approval of your health care provider.  Do not use hot tubs, steam rooms, or saunas.  Do not drink alcohol.  Do not use any products that contain nicotine or tobacco, such as cigarettes and e-cigarettes. If you need help quitting, ask your health care provider.  Do not use any medicinal herbs or unprescribed drugs. These chemicals affect the formation and growth of the baby.  Do not douche or use tampons or scented sanitary pads.  Do not cross your legs for long periods of time.  To prepare for the arrival of your baby: ? Take prenatal classes to understand, practice, and ask questions about labor and delivery. ? Make a trial run to the hospital. ? Visit the hospital and tour the maternity area. ? Arrange for maternity or paternity leave through employers. ? Arrange for family and friends to take care of pets while you are in the hospital. ? Purchase a rear-facing car seat and make sure you know how to install it in your car. ? Pack your hospital bag. ? Prepare the baby's nursery. Make sure to remove all pillows and stuffed animals from the baby's crib to prevent suffocation.  Visit your dentist if you have not gone during your pregnancy. Use a soft toothbrush to brush your teeth and be gentle when you floss. Contact a health care provider if:  You are unsure if you are in labor or if your water has broken.  You become dizzy.  You have mild pelvic cramps, pelvic pressure, or nagging pain in your abdominal area.  You have lower back pain.  You have persistent  nausea, vomiting, or diarrhea.  You have an unusual or bad smelling vaginal discharge.  You have pain when you urinate. Get help right away if:  Your water breaks before 37 weeks.  You have regular contractions less than 5 minutes apart before 37 weeks.  You have a fever.  You are leaking fluid from your vagina.  You have spotting or bleeding from your vagina.  You have severe abdominal pain or cramping.  You have rapid weight loss or weight gain.    You have shortness of breath with chest pain.  You notice sudden or extreme swelling of your face, hands, ankles, feet, or legs.  Your baby makes fewer than 10 movements in 2 hours.  You have severe headaches that do not go away when you take medicine.  You have vision changes. Summary  The third trimester is from week 28 through week 40, months 7 through 9. The third trimester is a time when the unborn baby (fetus) is growing rapidly.  During the third trimester, your discomfort may increase as you and your baby continue to gain weight. You may have abdominal, leg, and back pain, sleeping problems, and an increased need to urinate.  During the third trimester your breasts will keep growing and they will continue to become tender. A yellow fluid (colostrum) may leak from your breasts. This is the first milk you are producing for your baby.  False labor is a condition in which you feel small, irregular tightenings of the muscles in the womb (contractions) that eventually go away. These are called Braxton Hicks contractions. Contractions may last for hours, days, or even weeks before true labor sets in.  Signs of labor can include: abdominal cramps; regular contractions that start at 10 minutes apart and become stronger and more frequent with time; watery or bloody mucus discharge that comes from the vagina; increased pelvic pressure and dull back pain; and leaking of amniotic fluid. This information is not intended to replace advice  given to you by your health care provider. Make sure you discuss any questions you have with your health care provider. Document Released: 07/10/2001 Document Revised: 12/22/2015 Document Reviewed: 09/16/2012 Elsevier Interactive Patient Education  2017 [employer]  

## 2017-09-23 NOTE — Progress Notes (Signed)
  Subjective  Fetal Movement? yes Contractions? no Leaking Fluid? no Vaginal Bleeding? no Pt has been having 2 month h/o soft and watery BMs with blood most times, sometimes filling the toilet bowl w bloody stool, of concern.  Pale, fatigue.  Has h/o hemorrhoid and prior Rx steroid supp no help.  No prior h/o this problem.  Usually has constipation. Objective  BP 100/60   Wt 139 lb (63 kg)   LMP 03/05/2017 (Approximate)   BMI 22.44 kg/m  General: NAD Pumonary: no increased work of breathing Abdomen: gravid, non-tender Extremities: no edema Psychiatric: mood appropriate, affect full  Assessment  37 y.o. [id number] at 6870w6d by  12/10/2017, by Last Menstrual Period presenting for routine prenatal visit  Plan   Problem List Items Addressed This Visit      Other   High-risk pregnancy    Other Visit Diagnoses    [redacted] weeks gestation of pregnancy    -  Primary   Stool bloody       Relevant Orders   Ambulatory referral to Gastroenterology    GI referral. Labs today  Annamarie MajorPaul Annjeanette Sarwar, MD, Merlinda FrederickFACOG [facility], [facility] 09/23/2017  9:29 AM

## 2017-09-24 LAB — 28 WEEK RH+PANEL
BASOS: 1 %
Basophils Absolute: 0 10*3/uL (ref 0.0–0.2)
EOS (ABSOLUTE): 0.5 10*3/uL — ABNORMAL HIGH (ref 0.0–0.4)
Eos: 8 %
Gestational Diabetes Screen: 115 mg/dL (ref 65–139)
HEMOGLOBIN: 9.2 g/dL — AB (ref 11.1–15.9)
HIV Screen 4th Generation wRfx: NONREACTIVE
Hematocrit: 28.4 % — ABNORMAL LOW (ref 34.0–46.6)
IMMATURE GRANULOCYTES: 1 %
Immature Grans (Abs): 0 10*3/uL (ref 0.0–0.1)
LYMPHS: 15 %
Lymphocytes Absolute: 1 10*3/uL (ref 0.7–3.1)
MCH: 29.9 pg (ref 26.6–33.0)
MCHC: 32.4 g/dL (ref 31.5–35.7)
MCV: 92 fL (ref 79–97)
MONOS ABS: 0.6 10*3/uL (ref 0.1–0.9)
Monocytes: 9 %
NEUTROS ABS: 4.4 10*3/uL (ref 1.4–7.0)
NEUTROS PCT: 66 %
Platelets: 283 10*3/uL (ref 150–379)
RBC: 3.08 x10E6/uL — AB (ref 3.77–5.28)
RDW: 13.1 % (ref 12.3–15.4)
RPR Ser Ql: NONREACTIVE
WBC: 6.6 10*3/uL (ref 3.4–10.8)

## 2017-10-07 ENCOUNTER — Ambulatory Visit: Payer: BLUE CROSS/BLUE SHIELD | Admitting: Gastroenterology

## 2017-10-07 ENCOUNTER — Ambulatory Visit (INDEPENDENT_AMBULATORY_CARE_PROVIDER_SITE_OTHER): Payer: Medicaid Other | Admitting: Obstetrics and Gynecology

## 2017-10-07 ENCOUNTER — Encounter: Payer: Self-pay | Admitting: Obstetrics and Gynecology

## 2017-10-07 VITALS — BP 114/70 | Wt 142.0 lb

## 2017-10-07 DIAGNOSIS — O0993 Supervision of high risk pregnancy, unspecified, third trimester: Secondary | ICD-10-CM

## 2017-10-07 DIAGNOSIS — O360990 Maternal care for other rhesus isoimmunization, unspecified trimester, not applicable or unspecified: Secondary | ICD-10-CM

## 2017-10-07 DIAGNOSIS — O99013 Anemia complicating pregnancy, third trimester: Secondary | ICD-10-CM

## 2017-10-07 DIAGNOSIS — O99019 Anemia complicating pregnancy, unspecified trimester: Secondary | ICD-10-CM | POA: Insufficient documentation

## 2017-10-07 DIAGNOSIS — Z3A30 30 weeks gestation of pregnancy: Secondary | ICD-10-CM

## 2017-10-07 NOTE — Progress Notes (Signed)
Routine Prenatal Care Visit  Subjective  Monica Drake is a 37 y.o. [phone] at [redacted]w[redacted]d being seen today for ongoing prenatal care.  She is currently monitored for the following issues for this high-risk pregnancy and has High-risk pregnancy; Grade I hemorrhoids; Anti-E isoimmunization affecting pregnancy, antepartum; and Anemia of pregnancy on their problem list.  ----------------------------------------------------------------------------------- Patient reports no complaints.   Contractions: Not present. Vag. Bleeding: None.  Movement: Present. Denies leaking of fluid.  Seeing GI next week for h/o hemorrhoids and rectal/anal bleeding.  She has started iron already for anemia. Discussed TDAP. Refuses on religious grounds. ----------------------------------------------------------------------------------- The following portions of the patient's history were reviewed and updated as appropriate: allergies, current medications, past family history, past medical history, past social history, past surgical history and problem list. Problem list updated.  Objective  Blood pressure 114/70, weight 142 lb (64.4 kg), last menstrual period 03/05/2017, unknown if currently breastfeeding. Pregravid weight 123 lb (55.8 kg) Total Weight Gain 19 lb (8.618 kg) Urinalysis: Urine Protein: Negative Urine Glucose: Negative  Fetal Status: Fetal Heart Rate (bpm): 130 Fundal Height: 29 cm Movement: Present     General:  Alert, oriented and cooperative. Patient is in no acute distress.  Skin: Skin is warm and dry. No rash noted.   Cardiovascular: Normal heart rate noted  Respiratory: Normal respiratory effort, no problems with respiration noted  Abdomen: Soft, gravid, appropriate for gestational age. Pain/Pressure: Absent     Pelvic:  Cervical exam deferred        Extremities: Normal range of motion.  Edema: None  Mental Status: Normal mood and affect. Normal behavior. Normal judgment and thought content.    Assessment   37 y.o. [id number] at [redacted]w[redacted]d by  12/10/2017, by Last Menstrual Period presenting for routine prenatal visit  Plan   pregnancy5 Problems (from 03/05/17 to present)    Problem Noted Resolved   Anemia of pregnancy 10/07/2017 by Conard Novak, MD No   Overview Signed 10/07/2017  9:54 AM by Conard Novak, MD    Start iron at 30 weeks      Anti-E isoimmunization affecting pregnancy, antepartum 07/29/2017 by Vena Austria, MD No   Overview Addendum 07/31/2017  4:49 PM by Vena Austria, MD    Anti-E antibody on initial T&S Reportedly posistive since her G2 pregnancy, no history of blood transfusions.  Monitor serial T&S if titer rises >1:16 MCA doppler and DP referral 1:1 titer initial NOB labs to weak to titer follow up 07/29/17      High-risk pregnancy 06/21/2015 by [place] Bing, MD No   Overview Addendum 07/29/2017  4:55 PM by Vena Austria, MD    [facility]inic [facility] Prenatal Labs  Dating LMP = 10 week [place] Blood type: O/Positive/-- (12/17 1059)   Genetic Screen Declines all genetic screening Antibody:Positive, See Final Results (12/17 1059) Anti-E  Anatomic [place] Complete 07/29/17 Rubella: Immune Varicella: Immune  GTT Early: NA, Third trimester:  RPR: Non Reactive (12/17 1059)   Rhogam N/A HBsAg: Negative (12/17 1059)   TDaP vaccine              Flu Shot:Declines HIV: Non Reactive (12/17 1059)   Baby Food Breast                               GBS:   Contraception Declines Pap: 07/15/17 NIL HPV negative  CBB     CS/VBAC NA   Support Person Alaric Maisie Fus  Preterm labor symptoms and general obstetric precautions including but not limited to vaginal bleeding, contractions, leaking of fluid and fetal movement were reviewed in detail with the patient. Please refer to After Visit Summary for other counseling recommendations.   Return in about 2 weeks (around 10/21/2017) for Routine Prenatal Appointment.  Thomasene MohairStephen Jackson, MD, Merlinda FrederickFACOG [facility], [facility] 10/07/2017 10:04 AM

## 2017-10-14 ENCOUNTER — Ambulatory Visit: Payer: Medicaid Other

## 2017-10-14 ENCOUNTER — Ambulatory Visit (INDEPENDENT_AMBULATORY_CARE_PROVIDER_SITE_OTHER): Payer: Medicaid Other | Admitting: Podiatry

## 2017-10-14 ENCOUNTER — Ambulatory Visit: Payer: BLUE CROSS/BLUE SHIELD | Admitting: Gastroenterology

## 2017-10-14 DIAGNOSIS — S99921A Unspecified injury of right foot, initial encounter: Secondary | ICD-10-CM

## 2017-10-14 DIAGNOSIS — S9001XA Contusion of right ankle, initial encounter: Secondary | ICD-10-CM

## 2017-10-14 NOTE — Progress Notes (Signed)
She presents today as a pregnant female she is 34 weeks out in her pregnancy and due in May.  She states that she was moving this weekend into a new house and yesterday while packing and moving she rolled her ankle and heard and felt a pop as she points to the dorsal lateral aspect of the right foot.  She states that initially she is able to wiggle her toes and walk on the foot.  She states that it started to develop a lot of swelling and bruising and hurting.  ROS: Denies fever chills nausea vomiting muscle aches pains shortness of breath chest pain.  Objective: Vital signs are stable alert and oriented x3.  Pulses are palpable.  Neurologic sensorium is intact.  Deep tendon reflexes are intact.  Muscle strength is normal symmetrical bilateral.  She does have tenderness on abduction against resistance of the right foot with overlying swelling and ecchymosis dorsal lateral aspect of the right foot but the ankle appears to be normal and intact.  Radiographs: None taken due to pregnancy.  Assessment: Mild ankle sprain with possible fracture fifth met base right foot.  Plan: Discussed etiology pathology conservative versus surgical therapies.  At this point no x-rays will be taken due to pregnancy however we will place her in a compression anklet in a Cam walker and I will follow-up with her after she delivers.

## 2017-10-18 ENCOUNTER — Telehealth: Payer: Self-pay

## 2017-10-18 NOTE — Telephone Encounter (Signed)
Called pt to let her know it was okay to get and X-ray if orthopedic Dr would like, she would need to be double covered to make sure baby is safe. Told pt to call if she needs a note from OB to get the X-ray done

## 2017-10-18 NOTE — Telephone Encounter (Signed)
Pt called she fell and hurt her ankle a couple days ago she is 31w 4d, her orthopedic Dr did not want to x-ray because she is pregnant. Pt still wanted to know if her foot could be x-rayed.

## 2017-10-21 ENCOUNTER — Ambulatory Visit (INDEPENDENT_AMBULATORY_CARE_PROVIDER_SITE_OTHER): Payer: Medicaid Other | Admitting: Advanced Practice Midwife

## 2017-10-21 ENCOUNTER — Encounter: Payer: Self-pay | Admitting: Advanced Practice Midwife

## 2017-10-21 VITALS — BP 118/74 | Wt 144.0 lb

## 2017-10-21 DIAGNOSIS — Z3A32 32 weeks gestation of pregnancy: Secondary | ICD-10-CM

## 2017-10-21 NOTE — Progress Notes (Signed)
Routine Prenatal Care Visit  Subjective  Monica Drake is a 37 y.o. [phone] at [redacted]w[redacted]d being seen today for ongoing prenatal care.  She is currently monitored for the following issues for this high-risk pregnancy and has High-risk pregnancy; Grade I hemorrhoids; Anti-E isoimmunization affecting pregnancy, antepartum; and Anemia of pregnancy on their problem list.  ----------------------------------------------------------------------------------- Patient reports recent ankle injury. She is wearing a boot for the next 10 weeks and may need an x-ray if pain level does not improve.   Contractions: Not present. Vag. Bleeding: None.  Movement: Present. Denies leaking of fluid.  ----------------------------------------------------------------------------------- The following portions of the patient's history were reviewed and updated as appropriate: allergies, current medications, past family history, past medical history, past social history, past surgical history and problem list. Problem list updated.   Objective  Blood pressure 118/74, weight 144 lb (65.3 kg), last menstrual period 03/05/2017 Pregravid weight 123 lb (55.8 kg) Total Weight Gain 21 lb (9.526 kg) Urinalysis:      Fetal Status: Fetal Heart Rate (bpm): 133 Fundal Height: 32 cm Movement: Present     General:  Alert, oriented and cooperative. Patient is in no acute distress.  Skin: Skin is warm and dry. No rash noted.   Cardiovascular: Normal heart rate noted  Respiratory: Normal respiratory effort, no problems with respiration noted  Abdomen: Soft, gravid, appropriate for gestational age. Pain/Pressure: Absent     Pelvic:  Cervical exam deferred        Extremities: Normal range of motion.  Edema: None  Mental Status: Normal mood and affect. Normal behavior. Normal judgment and thought content.   Assessment   37 y.o. [id number] at [redacted]w[redacted]d by  12/10/2017, by Last Menstrual Period presenting for routine prenatal visit  Plan    pregnancy5 Problems (from 03/05/17 to present)    Problem Noted Resolved   Anemia of pregnancy 10/07/2017 by Conard Novak, MD No   Overview Signed 10/07/2017  9:54 AM by Conard Novak, MD    Start iron at 30 weeks      Anti-E isoimmunization affecting pregnancy, antepartum 07/29/2017 by Vena Austria, MD No   Overview Addendum 07/31/2017  4:49 PM by Vena Austria, MD    Anti-E antibody on initial T&S Reportedly posistive since her G2 pregnancy, no history of blood transfusions.  Monitor serial T&S if titer rises >1:16 MCA doppler and DP referral 1:1 titer initial NOB labs to weak to titer follow up 07/29/17      High-risk pregnancy 06/21/2015 by [place] Bing, MD No   Overview Addendum 07/29/2017  4:55 PM by Vena Austria, MD    [facility]inic [facility] Prenatal Labs  Dating LMP = 10 week [place] Blood type: O/Positive/-- (12/17 1059)   Genetic Screen Declines all genetic screening Antibody:Positive, See Final Results (12/17 1059) Anti-E  Anatomic [place] Complete 07/29/17 Rubella: Immune Varicella: Immune  GTT Early: NA, Third trimester:  RPR: Non Reactive (12/17 1059)   Rhogam N/A HBsAg: Negative (12/17 1059)   TDaP vaccine              Flu Shot:Declines HIV: Non Reactive (12/17 1059)   Baby Food Breast                               GBS:   Contraception Declines Pap: 07/15/17 NIL HPV negative  CBB     CS/VBAC NA   Support Person Alaric Maisie Fus  Preterm labor symptoms and general obstetric precautions including but not limited to vaginal bleeding, contractions, leaking of fluid and fetal movement were reviewed in detail with the patient.   Return in about 2 weeks (around 11/04/2017) for rob.  Tresea MallJane Delyla Sandeen, CNM 10/21/2017 9:54 AM

## 2017-10-21 NOTE — Progress Notes (Signed)
No vb. No lof. Pt twisted foot, in a boot

## 2017-11-04 ENCOUNTER — Other Ambulatory Visit: Payer: Self-pay | Admitting: Obstetrics and Gynecology

## 2017-11-04 ENCOUNTER — Ambulatory Visit (INDEPENDENT_AMBULATORY_CARE_PROVIDER_SITE_OTHER): Payer: Medicaid Other | Admitting: Obstetrics and Gynecology

## 2017-11-04 ENCOUNTER — Ambulatory Visit
Admission: RE | Admit: 2017-11-04 | Discharge: 2017-11-04 | Disposition: A | Discharge status: Home / Self Care | Payer: Medicaid Other | Source: Ambulatory Visit | Attending: Obstetrics and Gynecology | Admitting: Obstetrics and Gynecology

## 2017-11-04 ENCOUNTER — Encounter: Payer: Self-pay | Admitting: Obstetrics and Gynecology

## 2017-11-04 ENCOUNTER — Ambulatory Visit (INDEPENDENT_AMBULATORY_CARE_PROVIDER_SITE_OTHER): Payer: Medicaid Other | Admitting: Gastroenterology

## 2017-11-04 ENCOUNTER — Encounter: Payer: Self-pay | Admitting: Gastroenterology

## 2017-11-04 VITALS — BP 102/62 | Wt 146.0 lb

## 2017-11-04 VITALS — BP 86/54 | HR 82 | Temp 97.4°F | Ht 67.0 in | Wt 145.6 lb

## 2017-11-04 DIAGNOSIS — O99613 Diseases of the digestive system complicating pregnancy, third trimester: Secondary | ICD-10-CM | POA: Diagnosis not present

## 2017-11-04 DIAGNOSIS — K921 Melena: Secondary | ICD-10-CM

## 2017-11-04 DIAGNOSIS — N2889 Other specified disorders of kidney and ureter: Secondary | ICD-10-CM

## 2017-11-04 DIAGNOSIS — O26833 Pregnancy related renal disease, third trimester: Secondary | ICD-10-CM | POA: Insufficient documentation

## 2017-11-04 DIAGNOSIS — O0993 Supervision of high risk pregnancy, unspecified, third trimester: Secondary | ICD-10-CM

## 2017-11-04 DIAGNOSIS — Z3A35 35 weeks gestation of pregnancy: Secondary | ICD-10-CM | POA: Diagnosis not present

## 2017-11-04 DIAGNOSIS — K625 Hemorrhage of anus and rectum: Secondary | ICD-10-CM

## 2017-11-04 DIAGNOSIS — O99013 Anemia complicating pregnancy, third trimester: Secondary | ICD-10-CM

## 2017-11-04 DIAGNOSIS — O26843 Uterine size-date discrepancy, third trimester: Secondary | ICD-10-CM

## 2017-11-04 DIAGNOSIS — O360990 Maternal care for other rhesus isoimmunization, unspecified trimester, not applicable or unspecified: Secondary | ICD-10-CM

## 2017-11-04 DIAGNOSIS — R197 Diarrhea, unspecified: Secondary | ICD-10-CM | POA: Diagnosis not present

## 2017-11-04 DIAGNOSIS — N281 Cyst of kidney, acquired: Secondary | ICD-10-CM | POA: Diagnosis not present

## 2017-11-04 DIAGNOSIS — K640 First degree hemorrhoids: Secondary | ICD-10-CM

## 2017-11-04 NOTE — Progress Notes (Signed)
Routine Prenatal Care Visit  Subjective  Monica Drake is a 37 y.o. [phone] at 2347w6d being seen today for ongoing prenatal care.  She is currently monitored for the following issues for this high-risk pregnancy and has High-risk pregnancy; Grade I hemorrhoids; Anti-E isoimmunization affecting pregnancy, antepartum; and Anemia of pregnancy on their problem list.  ----------------------------------------------------------------------------------- Patient reports that she has had a significant change in her bowel movements. She was having constipation early in her pregnancy but it has switched since mid December from constipation to diarrhea and liquid stool. I saw her in December and prescribed her suppositories for her hemrrohoids. She says that she waited two weeks to use them, but when she did her bowel movements were actually worse.  She is having now frankly bloody bowel movements. She has tried to decrease the amount of fiber in her diet, but this has not changes her symptoms. She is having accidents throughout the day were her underwear is filled with bloody mucus from her rectum and she has to wear a panty liner. She reports that when she wakes in the morning she will have an urge similar to diarrhea and rush to the bathroom and just release from her rectum bloody mucus which is all liquid. She showed me several photos on her phone of these bloody bowel movements which consisted of very little or no stool. She says that when she passes flatulence she has releases of blood and mucus from her rectum.  She reports occasional sharp left sided pain and diffuse abdominal cramping although it is not severe or constant.  She has had worsening of her anemia through out this pregnancy despite iron supplementations. She has had normal weight gain.   Contractions: Not present. Vag. Bleeding: None.  Movement: Present. Denies leaking of fluid.    ----------------------------------------------------------------------------------- The following portions of the patient's history were reviewed and updated as appropriate: allergies, current medications, past family history, past medical history, past social history, past surgical history and problem list. Problem list updated.   Objective  Last menstrual period 03/05/2017, unknown if currently breastfeeding. Pregravid weight 123 lb (55.8 kg) Total Weight Gain 23 lb (10.4 kg) Urinalysis: Urine Protein: Negative Urine Glucose: Negative  Fetal Status: Fetal Heart Rate (bpm): 130 Fundal Height: 32 cm Movement: Present     General:  Alert, oriented and cooperative. Patient is in no acute distress.  Skin: Skin is warm and dry. No rash noted.   Cardiovascular: Normal heart rate noted  Respiratory: Normal respiratory effort, no problems with respiration noted  Abdomen: Soft, gravid, appropriate for gestational age. Pain/Pressure: Absent     Pelvic: Rectal:  Cervical exam deferred        Small external hemorrhoid,no active bleeding, slightly firm and blue color. Internal exam did not show any internal hemorrhoids or masses.   Bloody mucus present on finger after exam. Patient reported that when she changed her underwear she had had another accident.   Extremities: Normal range of motion.  Edema: None  ental Status: Normal mood and affect. Normal behavior. Normal judgment and thought content.     Assessment   37 y.o. [id number] at 2747w6d by  12/10/2017, by Last Menstrual Period presenting for routine prenatal visit  Plan   pregnancy5 Problems (from 03/05/17 to present)    Problem Noted Resolved   Anemia of pregnancy 10/07/2017 by Conard NovakJackson, Stephen D, MD No   Overview Signed 10/07/2017  9:54 AM by Conard NovakJackson, Stephen D, MD    Start iron at  30 weeks      Anti-E isoimmunization affecting pregnancy, antepartum 07/29/2017 by Vena Austria, MD No   Overview Addendum 07/31/2017  4:49 PM by Vena Austria, MD    Anti-E antibody on initial T&S Reportedly posistive since her G2 pregnancy, no history of blood transfusions.  Monitor serial T&S if titer rises >1:16 MCA doppler and DP referral 1:1 titer initial NOB labs to weak to titer follow up 07/29/17      High-risk pregnancy 06/21/2015 by Ingalls Bing, MD No   Overview Addendum 07/29/2017  4:55 PM by Vena Austria, MD    Clinic [facility] Prenatal Labs  Dating LMP = 10 week [place] Blood type: O/Positive/-- (12/17 1059)   Genetic Screen Declines all genetic screening Antibody:Positive, See Final Results (12/17 1059) Anti-E  Anatomic [place] Complete 07/29/17 Rubella: Immune Varicella: Immune  GTT Early: NA, Third trimester:  RPR: Non Reactive (12/17 1059)   Rhogam N/A HBsAg: Negative (12/17 1059)   TDaP vaccine              Flu Shot:Declines HIV: Non Reactive (12/17 1059)   Baby Food Breast                               GBS:   Contraception Declines Pap: 07/15/17 NIL HPV negative  CBB     CS/VBAC NA   Support Person Alaric Skalicky                 Gestational age appropriate obstetric precautions including but not limited to vaginal bleeding, contractions, leaking of fluid and fetal movement were reviewed in detail with the patient.    I discussed with Kendyl that I was very worried about the symptoms that she was describing and that theses changes in her bowel movements were outside of the normal symptoms seen with hemorrhoids. She was referred to Dr. Tobi Bastos and has an appointment scheduled with them for this afternoon. I am adding on a STAT MRI today before the appointment so that this can be assisted in her evaluation.  Uterine size less than dates, will obtain growth Korea and AFI at her next visit.   No follow-ups on file.  Adelene Idler MD [facility], [facility] 11/04/2017, 7:01 PM  UPDATE: after 5 PM I spoke with Dr. Tobi Bastos, He believes that the three possibilities in her differential are hemorrhoids,   ulcerative colitis, malignancy. He will be available to do a sigmoidoscopy or a colonoscopy at any time with one days notice. He can do the colonoscopy in the hospital before she goes home postpartum.  I then called and spoke to the patient about the situation and explained the differential and next steps. I answered her questions and spoke to her for 45 minutes on the phone. I believe that it early term delivery needs to be considered to benefit maternal health and maximize her outcome if this were to be a malignancy. I will obtain a consultation regarding this with maternal fetal medicine. Also right renal mass will need to be further evaluated postpartum.

## 2017-11-04 NOTE — Progress Notes (Signed)
Monica MoodKiran Edoardo Laforte MD, MRCP(U.K) [address]  [address]  [place], [place] [address]  Main: [phone]  Fax: [phone]   Gastroenterology Consultation  Referring Provider:     Nadara MustardHarris, Robert P, MD Primary Care Physician:  Monica MustardHarris, Robert P, MD Primary Gastroenterologist:  Dr. Wyline MoodKiran Deletha Drake  Reason for Consultation:     Rectal bleeding         HPI:   Monica Drake is a 37 y.o. y/o female referred for consultation & management  by Dr. Tiburcio PeaHarris, Harrel Lemonobert P, MD.    She has been referred for blood in her stool. No recent labs available.    She had a MRI of her abdomen earlier today . Showed large stool volume in the right , transverse and descending colon suggestive of constipation. Complex 2.7 cm renal cyst , gravid uterus. No evidence of bowel wall thickening .   She says that she has had rectal bleeding began in 05/2017- gradually got worse. Occurs every time she has a bowel movement . Even when she passes gas, She has brought pictures on her phone.Bright red blood on the toilet bowl. She has a lot of blood when she cleans herself. Noted blood clots.  She thought it may resolve spontaneously but it didn't .   She is presently due to deliver on may 14th 2019 .    She says she has not had a recent blood count. No family history of colon cancer. She is a stay at home mom . Per patient baby is doing well.   She has a bowel movement 4-5 times a day , all liquid. 2-3 times has an accident , lots of cramping . Occasionally has a lot of left lower quadrant cramping . Not much stool comes out.,    No past medical history on file.  Past Surgical History:  Procedure Laterality Date  . ECTOPIC PREGNANCY SURGERY  02/27/2005  . HERNIA REPAIR      Prior to Admission medications   Medication Sig Start Date End Date Taking? Authorizing Provider  acetaminophen (TYLENOL) 325 MG tablet Take by mouth. 09/14/10   [provider]  hydrocortisone (ANUSOL-HC) 25 MG suppository Place 1  suppository (25 mg total) rectally 2 (two) times daily. Patient not taking: Reported on 07/29/2017 07/15/17   Natale MilchSchuman, Monica R, MD  prenatal vitamin w/FE, FA (NATACHEW) 29-1 MG CHEW chewable tablet Chew 1 tablet by mouth daily at 12 noon.    [provider]    Family History  Problem Relation Age of Onset  . Diabetes Mother   . Hypertension Mother   . Arthritis Mother   . Heart disease Mother   . Hypertension Father   . Arthritis Father   . Birth defects Sister      Social History   Tobacco Use  . Smoking status: Never Smoker  . Smokeless tobacco: Never Used  Substance Use Topics  . Alcohol use: No  . Drug use: No    Allergies as of 11/04/2017  . (No Known Allergies)    Review of Systems:    All systems reviewed and negative except where noted in HPI.   Physical Exam:  LMP 03/05/2017 (Approximate)  Patient's last menstrual period was 03/05/2017 (approximate). Psych:  Alert and cooperative. Normal mood and affect. General:   Alert,  Well-developed, well-nourished, pleasant and cooperative in NAD Head:  Normocephalic and atraumatic. Eyes:  Sclera clear, no icterus.   Conjunctiva pink. Ears:  Normal auditory acuity. Nose:  No deformity,  discharge, or lesions. Mouth:  No deformity or lesions,oropharynx pink & moist. Neck:  Supple; no masses or thyromegaly. Lungs:  Respirations even and unlabored.  Clear throughout to auscultation.   No wheezes, crackles, or rhonchi. No acute distress. Heart:  Regular rate and rhythm; no murmurs, clicks, rubs, or gallops. Abdomen:  Normal bowel sounds.  No bruits.  Soft,distended, gravid uterus , hepatosplenomegaly or hernias noted.  No guarding or rebound tenderness.    Neurologic:  Alert and oriented x3;  grossly normal neurologically. Skin:  Intact without significant lesions or rashes. No jaundice. Lymph Nodes:  No significant cervical adenopathy. Psych:  Alert and cooperative. Normal mood and affect.  Imaging  Studies: Mr Pelvis Wo Contrast  Result Date: 11/04/2017 CLINICAL DATA:  37 year old pregnant female at [redacted] weeks gestation. Bloody diarrhea several times a day for several months. EXAM: MRI ABDOMEN AND PELVIS WITHOUT CONTRAST TECHNIQUE: Multiplanar multisequence MR imaging of the abdomen and pelvis was performed. No intravenous contrast was administered. COMPARISON:  None. FINDINGS: COMBINED FINDINGS FOR BOTH MR ABDOMEN AND PELVIS Lower chest: No acute abnormality at the lung bases. Hepatobiliary: Normal liver size and configuration. No hepatic steatosis. No liver mass. No cholelithiasis. Layering gallbladder sludge in the nondistended gallbladder. No gallbladder wall thickening or pericholecystic fluid. No biliary ductal dilatation. Common bile duct diameter 3 mm. No choledocholithiasis. Pancreas: No pancreatic mass or duct dilation.  No pancreas divisum. Spleen: Normal size pancreas. A few tiny T2 hyperintense lesions are scattered in the spleen, largest 0.5 cm (series 11/image 11), which are incompletely characterized on this noncontrast MRI study and are probably benign. Adrenals/Urinary Tract: Normal adrenals. No hydronephrosis. There is a 2.7 x 2.5 cm complex renal cyst in the posterior interpolar right kidney (series 12/image 17) with heterogeneous signal intensity on T2 sequences and isointensity on T1 sequences, incompletely characterized on this noncontrast MRI. No additional renal lesions. Normal collapsed bladder. Stomach/Bowel: Grossly normal stomach. Normal caliber small bowel, with no small bowel wall thickening. Appendix not discretely visualized. No pericecal inflammatory changes. Large volume of stool in the right, transverse and descending colon. Sigmoid colon and rectum are collapsed. No convincing large bowel wall thickening or pericolonic inflammatory changes. Vascular/Lymphatic: Normal caliber abdominal aorta. No pathologically enlarged lymph nodes in the abdomen or pelvis. Reproductive:  Enlarged gravid uterus with single intrauterine gestation in cephalic lie. Uterine cavity extends superiorly to the L2 vertebral level. This MRI study is not tailored for fetal evaluation. Anterior placenta with no evidence of placental abruption or previa. No appreciable uterine fibroids. Cervix length 2.8 cm. No evidence of internal cervical funneling. Amniotic fluid volume appears subjectively normal. Other: No abdominal ascites or focal fluid collection. Musculoskeletal: No aggressive appearing focal osseous lesions. IMPRESSION: 1. Large stool volume in the right, transverse and descending colon, suggesting constipation. Sigmoid colon and rectum are collapsed. No evidence of bowel obstruction or acute bowel inflammation. 2. Complex 2.7 cm renal cyst in the posterior interpolar right kidney, incompletely characterized on this noncontrast MRI study. Statistically in a patient of this age, a hemorrhagic/proteinaceous Bosniak category 2 renal cyst is most likely. Renal neoplasm cannot be strictly excluded on the basis of this scan. Advise postpartum follow-up with either renal ultrasound or MRI abdomen without and with IV contrast. 3. Enlarged gravid uterus with single intrauterine gestation in cephalic lie. This MRI study is not tailored for fetal evaluation. No evidence of acute gestational abnormality. Cervix length 2.8 cm without internal cervical funneling. Subjectively normal amniotic fluid volume. Electronically Signed   By:  Delbert Phenix M.D.   On: 11/04/2017 13:35   Mr Abdomen Wo Contrast  Result Date: 11/04/2017 CLINICAL DATA:  37 year old pregnant female at [redacted] weeks gestation. Bloody diarrhea several times a day for several months. EXAM: MRI ABDOMEN AND PELVIS WITHOUT CONTRAST TECHNIQUE: Multiplanar multisequence MR imaging of the abdomen and pelvis was performed. No intravenous contrast was administered. COMPARISON:  None. FINDINGS: COMBINED FINDINGS FOR BOTH MR ABDOMEN AND PELVIS Lower chest: No acute  abnormality at the lung bases. Hepatobiliary: Normal liver size and configuration. No hepatic steatosis. No liver mass. No cholelithiasis. Layering gallbladder sludge in the nondistended gallbladder. No gallbladder wall thickening or pericholecystic fluid. No biliary ductal dilatation. Common bile duct diameter 3 mm. No choledocholithiasis. Pancreas: No pancreatic mass or duct dilation.  No pancreas divisum. Spleen: Normal size pancreas. A few tiny T2 hyperintense lesions are scattered in the spleen, largest 0.5 cm (series 11/image 11), which are incompletely characterized on this noncontrast MRI study and are probably benign. Adrenals/Urinary Tract: Normal adrenals. No hydronephrosis. There is a 2.7 x 2.5 cm complex renal cyst in the posterior interpolar right kidney (series 12/image 17) with heterogeneous signal intensity on T2 sequences and isointensity on T1 sequences, incompletely characterized on this noncontrast MRI. No additional renal lesions. Normal collapsed bladder. Stomach/Bowel: Grossly normal stomach. Normal caliber small bowel, with no small bowel wall thickening. Appendix not discretely visualized. No pericecal inflammatory changes. Large volume of stool in the right, transverse and descending colon. Sigmoid colon and rectum are collapsed. No convincing large bowel wall thickening or pericolonic inflammatory changes. Vascular/Lymphatic: Normal caliber abdominal aorta. No pathologically enlarged lymph nodes in the abdomen or pelvis. Reproductive: Enlarged gravid uterus with single intrauterine gestation in cephalic lie. Uterine cavity extends superiorly to the L2 vertebral level. This MRI study is not tailored for fetal evaluation. Anterior placenta with no evidence of placental abruption or previa. No appreciable uterine fibroids. Cervix length 2.8 cm. No evidence of internal cervical funneling. Amniotic fluid volume appears subjectively normal. Other: No abdominal ascites or focal fluid collection.  Musculoskeletal: No aggressive appearing focal osseous lesions. IMPRESSION: 1. Large stool volume in the right, transverse and descending colon, suggesting constipation. Sigmoid colon and rectum are collapsed. No evidence of bowel obstruction or acute bowel inflammation. 2. Complex 2.7 cm renal cyst in the posterior interpolar right kidney, incompletely characterized on this noncontrast MRI study. Statistically in a patient of this age, a hemorrhagic/proteinaceous Bosniak category 2 renal cyst is most likely. Renal neoplasm cannot be strictly excluded on the basis of this scan. Advise postpartum follow-up with either renal ultrasound or MRI abdomen without and with IV contrast. 3. Enlarged gravid uterus with single intrauterine gestation in cephalic lie. This MRI study is not tailored for fetal evaluation. No evidence of acute gestational abnormality. Cervix length 2.8 cm without internal cervical funneling. Subjectively normal amniotic fluid volume. Electronically Signed   By: Delbert Phenix M.D.   On: 11/04/2017 13:35    Assessment and Plan:   Monica Drake is a 37 y.o. y/o female has been referred for rectal bleeding ongoing since 05/2017. She is presently [redacted] weeks pregnant . No labs available but MRI shows no colitis or enteritis . She is having a lot of blood in the toilet bowel which is painless . Very likely from hemorrhoids. MRI shows lot of stool burden. She was not interested in a rectal exam , she just had one with her OBGYN and was told no hemorrhoids externally.   Explained that the issue  is complicated as she is so close to her delivery date. Generally  Flexible sigmoidoscopy unsedated is reasonably safe but she chose not to have any tests till after the pregnancy . I suggested in the interim we increase her dietary fiber, add metamucil and if not having adequate bowel movements to add daily miralax. Consume lots of fluids, fruit juices. Will plan for a colonoscopy as soon as possible after  delivery. I will also check her stool for infection, CBC today   Follow up in 10 days  Dr Monica Mood MD,MRCP(U.K)

## 2017-11-04 NOTE — Progress Notes (Signed)
Pt had foot injury 10/13/17 and wearing boot. Has appt with specialist later today for hemorrhoids. Concerned about about blood count and iron.

## 2017-11-05 ENCOUNTER — Telehealth: Payer: Self-pay

## 2017-11-05 NOTE — Telephone Encounter (Signed)
Results discussed with pt by Dr. Jerene PitchSchuman on 11/04/17 after 5 pm. See updated visit note. Pt had left triage msg on 11/04/17 at 4:51 pm

## 2017-11-05 NOTE — Telephone Encounter (Signed)
Referral received for pt to have growth u/s and consult for melena at [facility]. Pt aware they will contact her for appt.

## 2017-11-05 NOTE — Telephone Encounter (Signed)
Pt called stating Dr.Schuman wanted her to call back with her results and would like to speak with her about the outcome of her MRI and her appt with the GI doctor.

## 2017-11-06 ENCOUNTER — Other Ambulatory Visit: Payer: Self-pay | Admitting: Obstetrics and Gynecology

## 2017-11-06 ENCOUNTER — Telehealth: Payer: Self-pay

## 2017-11-06 DIAGNOSIS — K921 Melena: Secondary | ICD-10-CM

## 2017-11-06 LAB — COMPREHENSIVE METABOLIC PANEL
ALT: 12 IU/L (ref 0–32)
AST: 19 IU/L (ref 0–40)
Albumin/Globulin Ratio: 1.3 (ref 1.2–2.2)
Albumin: 3.3 g/dL — ABNORMAL LOW (ref 3.5–5.5)
Alkaline Phosphatase: 107 IU/L (ref 39–117)
BUN/Creatinine Ratio: 14 (ref 9–23)
BUN: 7 mg/dL (ref 6–20)
Bilirubin Total: <0.2 mg/dL (ref 0.0–1.2)
CALCIUM: 8.7 mg/dL (ref 8.7–10.2)
CO2: 21 mmol/L (ref 20–29)
CREATININE: 0.51 mg/dL — AB (ref 0.57–1.00)
Chloride: 104 mmol/L (ref 96–106)
GFR calc non Af Amer: 126 mL/min/{1.73_m2} (ref >59)
GFR, EST AFRICAN AMERICAN: 145 mL/min/{1.73_m2} (ref >59)
Globulin, Total: 2.5 g/dL (ref 1.5–4.5)
Glucose: 72 mg/dL (ref 65–99)
Potassium: 4.1 mmol/L (ref 3.5–5.2)
Sodium: 138 mmol/L (ref 134–144)
TOTAL PROTEIN: 5.8 g/dL — AB (ref 6.0–8.5)

## 2017-11-06 LAB — CBC WITH DIFFERENTIAL/PLATELET
BASOS: 1 %
Basophils Absolute: 0 10*3/uL (ref 0.0–0.2)
EOS (ABSOLUTE): 0.6 10*3/uL — AB (ref 0.0–0.4)
EOS: 9 %
HEMATOCRIT: 29.7 % — AB (ref 34.0–46.6)
Hemoglobin: 9.8 g/dL — ABNORMAL LOW (ref 11.1–15.9)
IMMATURE GRANS (ABS): 0.1 10*3/uL (ref 0.0–0.1)
IMMATURE GRANULOCYTES: 1 %
LYMPHS: 17 %
Lymphocytes Absolute: 1.1 10*3/uL (ref 0.7–3.1)
MCH: 29.5 pg (ref 26.6–33.0)
MCHC: 33 g/dL (ref 31.5–35.7)
MCV: 90 fL (ref 79–97)
Monocytes Absolute: 0.7 10*3/uL (ref 0.1–0.9)
Monocytes: 12 %
NEUTROS PCT: 60 %
Neutrophils Absolute: 3.9 10*3/uL (ref 1.4–7.0)
PLATELETS: 289 10*3/uL (ref 150–379)
RBC: 3.32 x10E6/uL — AB (ref 3.77–5.28)
RDW: 13.9 % (ref 12.3–15.4)
WBC: 6.4 10*3/uL (ref 3.4–10.8)

## 2017-11-06 NOTE — Telephone Encounter (Signed)
LVM for patient callback for information per Dr. Tobi BastosAnna.    - Please inform her Hb is stable, I did discuss her with Dr Jerene PitchSchuman day before yesterday . Will plan to perform colonoscopy after delivery . IF notices increase in bleeding to call back to check CBC or if has diziness or light headness.

## 2017-11-07 ENCOUNTER — Telehealth: Payer: Self-pay | Admitting: Obstetrics and Gynecology

## 2017-11-07 ENCOUNTER — Encounter: Payer: Self-pay | Admitting: Gastroenterology

## 2017-11-07 LAB — CLOSTRIDIUM DIFFICILE EIA: C difficile Toxins A+B, EIA: NEGATIVE

## 2017-11-07 NOTE — Telephone Encounter (Signed)
Called and discussed with patient her current symptoms and options moving forward. Patient reported still having bowel movements consisting of mostly frank blood, mucus, and clots. She estimated about 1 tablespoon of stool mixed into bowel movements throughout the day.  She reports 4-5 bloody bowel movements a day of which 2-3 are accidents where she has a sharp pain and a sudden urge but despite hurrying to the bathroom will not make it. I discussed this with Dr. Tobi BastosAnna who offered to perform a sigmoidoscopy on Monday at 7:30. He felt that with the sigmoidoscopy he would be able to determine if this was ulcerative colitis or take biopsies if a mass were seen. I will be present to observe and monitor the fetal heart tracing. Dr. Tobi BastosAnna can perform the sigmoidoscopy without sedation. I had previously discussed with MFM the consideration of having the patient admitted at [facility] for evaluation with their GI, but this plan is better for the patient so that an admission can be avoided. The procedure can be performed ahead of her MFM appointment. I notified Dr. Clovis RileyMitchell of this plan and she agreed that it seemed to be in the interest of the patient. I also notified the MFM office that she would be having the procedure at 7:30 and possibly 15-20 minutes late to her 8 am appointment.  Over 1.5 hours spent on phone coordinating care with other providers and patient.   Adelene Idlerhristanna Marques Ericson MD [facility], [facility] 11/07/17 6:55 PM

## 2017-11-08 ENCOUNTER — Encounter: Payer: Self-pay | Admitting: *Deleted

## 2017-11-08 ENCOUNTER — Other Ambulatory Visit: Payer: Self-pay

## 2017-11-08 DIAGNOSIS — K625 Hemorrhage of anus and rectum: Secondary | ICD-10-CM

## 2017-11-08 LAB — GI PROFILE, STOOL, PCR
ASTROVIRUS: NOT DETECTED
Adenovirus F 40/41: NOT DETECTED
C difficile toxin A/B: NOT DETECTED
CRYPTOSPORIDIUM: NOT DETECTED
CYCLOSPORA CAYETANENSIS: NOT DETECTED
Campylobacter: NOT DETECTED
ENTAMOEBA HISTOLYTICA: NOT DETECTED
Enteroaggregative E coli: NOT DETECTED
Enteropathogenic E coli: NOT DETECTED
Enterotoxigenic E coli: NOT DETECTED
Giardia lamblia: NOT DETECTED
Norovirus GI/GII: NOT DETECTED
PLESIOMONAS SHIGELLOIDES: NOT DETECTED
ROTAVIRUS A: NOT DETECTED
SALMONELLA: NOT DETECTED
SHIGELLA/ENTEROINVASIVE E COLI: NOT DETECTED
Sapovirus: NOT DETECTED
Shiga-toxin-producing E coli: NOT DETECTED
VIBRIO CHOLERAE: NOT DETECTED
VIBRIO: NOT DETECTED
Yersinia enterocolitica: NOT DETECTED

## 2017-11-08 LAB — CALPROTECTIN, FECAL: CALPROTECTIN, FECAL: 250 ug/g — AB (ref 0–120)

## 2017-11-11 ENCOUNTER — Ambulatory Visit (HOSPITAL_BASED_OUTPATIENT_CLINIC_OR_DEPARTMENT_OTHER)
Admission: RE | Admit: 2017-11-11 | Discharge: 2017-11-11 | Disposition: A | Discharge status: Home / Self Care | Payer: Medicaid Other | Source: Ambulatory Visit | Attending: Maternal and Fetal Medicine | Admitting: Maternal and Fetal Medicine

## 2017-11-11 ENCOUNTER — Encounter: Payer: Self-pay | Admitting: Anesthesiology

## 2017-11-11 ENCOUNTER — Ambulatory Visit: Payer: Medicaid Other | Admitting: Gastroenterology

## 2017-11-11 ENCOUNTER — Other Ambulatory Visit: Payer: Self-pay

## 2017-11-11 ENCOUNTER — Telehealth: Payer: Self-pay | Admitting: Gastroenterology

## 2017-11-11 ENCOUNTER — Encounter: Payer: Medicaid Other | Admitting: Advanced Practice Midwife

## 2017-11-11 ENCOUNTER — Encounter
Admission: RE | Disposition: A | Discharge status: Home / Self Care | Payer: Self-pay | Source: Ambulatory Visit | Attending: Gastroenterology

## 2017-11-11 ENCOUNTER — Ambulatory Visit
Admission: RE | Admit: 2017-11-11 | Discharge: 2017-11-11 | Disposition: A | Discharge status: Home / Self Care | Payer: Medicaid Other | Source: Ambulatory Visit | Attending: Gastroenterology | Admitting: Gastroenterology

## 2017-11-11 DIAGNOSIS — O99613 Diseases of the digestive system complicating pregnancy, third trimester: Secondary | ICD-10-CM | POA: Insufficient documentation

## 2017-11-11 DIAGNOSIS — K51011 Ulcerative (chronic) pancolitis with rectal bleeding: Secondary | ICD-10-CM

## 2017-11-11 DIAGNOSIS — Z3A35 35 weeks gestation of pregnancy: Secondary | ICD-10-CM | POA: Diagnosis not present

## 2017-11-11 DIAGNOSIS — K625 Hemorrhage of anus and rectum: Secondary | ICD-10-CM | POA: Diagnosis not present

## 2017-11-11 DIAGNOSIS — K529 Noninfective gastroenteritis and colitis, unspecified: Secondary | ICD-10-CM | POA: Insufficient documentation

## 2017-11-11 DIAGNOSIS — K5900 Constipation, unspecified: Secondary | ICD-10-CM | POA: Insufficient documentation

## 2017-11-11 DIAGNOSIS — K5190 Ulcerative colitis, unspecified, without complications: Secondary | ICD-10-CM

## 2017-11-11 HISTORY — DX: Hypoglycemia, unspecified: E16.2

## 2017-11-11 HISTORY — DX: Unspecified fracture of right foot, initial encounter for closed fracture: S92.901A

## 2017-11-11 HISTORY — DX: Encounter for supervision of normal pregnancy, unspecified, third trimester: Z34.93

## 2017-11-11 HISTORY — DX: Ulcerative colitis, unspecified, without complications: K51.90

## 2017-11-11 HISTORY — PX: FLEXIBLE SIGMOIDOSCOPY: SHX5431

## 2017-11-11 LAB — GLUCOSE, CAPILLARY
GLUCOSE-CAPILLARY: 52 mg/dL — AB (ref 65–99)
Glucose-Capillary: 147 mg/dL — ABNORMAL HIGH (ref 65–99)
Glucose-Capillary: 192 mg/dL — ABNORMAL HIGH (ref 65–99)

## 2017-11-11 LAB — CBC
HCT: 28.5 % — ABNORMAL LOW (ref 35.0–47.0)
Hemoglobin: 9.4 g/dL — ABNORMAL LOW (ref 12.0–16.0)
MCH: 30.4 pg (ref 26.0–34.0)
MCHC: 33.1 g/dL (ref 32.0–36.0)
MCV: 91.9 fL (ref 80.0–100.0)
PLATELETS: 270 10*3/uL (ref 150–440)
RBC: 3.1 MIL/uL — ABNORMAL LOW (ref 3.80–5.20)
RDW: 14 % (ref 11.5–14.5)
WBC: 5 10*3/uL (ref 3.6–11.0)

## 2017-11-11 LAB — FERRITIN: Ferritin: 5 ng/mL — ABNORMAL LOW (ref 11–307)

## 2017-11-11 SURGERY — SIGMOIDOSCOPY, FLEXIBLE

## 2017-11-11 MED ORDER — MESALAMINE 1.2 G PO TBEC: 4.8000 g | DELAYED_RELEASE_TABLET | Freq: Every day | ORAL | 0 refills | Status: DC

## 2017-11-11 MED ORDER — DEXTROSE 50 % IV SOLN: 25.0000 mL | Freq: Once | INTRAVENOUS | Status: DC

## 2017-11-11 MED ORDER — PREDNISONE 5 MG PO TABS: ORAL_TABLET | ORAL | 0 refills | Status: DC

## 2017-11-11 MED ORDER — DEXTROSE 50 % IV SOLN
INTRAVENOUS | Status: AC
  Filled 2017-11-11: qty 50

## 2017-11-11 MED ORDER — SODIUM CHLORIDE 0.9 % IV SOLN: INTRAVENOUS | Status: DC

## 2017-11-11 NOTE — Progress Notes (Signed)
[facility] Consultation   Chief Complaint: Bloody diarrhea in pregnancy  HPI: [name] is a 37 y.o. [id number] at 4661w6d  who presents in consultation  for bloody diarrhea during pregnancy.  The patient reports that the pregnancy started out complicated by constipation and developed rectal bleeding. She saw GI in November and was given a suppository but took it for a week and found no relief so stopped taking it. Since that time she has developed worsening bloody diarrhea with mucous. HEr hemoglobin has been stable over several month at approximately 9. This morning she had a colonoscopy and biopsies and found to have inflammatory bowel disease, likely consistent with ulcerative colitis. She was prescribed medications this am (prednisone and mesalamine) but hasn't started them yet.   Past Medical History: Patient  has a past medical history of Foot fracture, right, Hypoglycemia, and Pregnant and not yet delivered in third trimester.  Past Surgical History: She  has a past surgical history that includes Hernia repair and Ectopic pregnancy surgery (02/27/2005).  Obstetric History:  OB History    Gravida  6   Para  4   Term  4   Preterm      AB  1   Living  4     SAB  1   TAB      Ectopic      Multiple  0   Live Births  4           Medications: She is also taking iron, PNV, and tylenol as needed for headache Allergies: Patient has No Known Allergies.  Social History: Patient  reports that she has never smoked. She has never used smokeless tobacco. She reports that she does not drink alcohol or use drugs.  Family History: family history includes Arthritis in her father and mother; Birth defects in her sister; Diabetes in her mother; Heart disease in her mother; Hypertension in her father and mother.   US last week 76%ile with normal fluid CBC last Tuesday hgb 9.8 (stable from 3 months prior)  Asessement: Inflammatory bowel disease in pregnancy,  likely UC  Plan: - We discussed that treatment of UC during pregnancy is recommended and safe. I encouraged the patient to begin therapy today. We would recommend medical therapy and continued pregnancy unless surgical intervention is warranted. Ideally we will not plan delivery until as long as possible from control of this UC flare. I expect that she should begin to have some relief of her symptoms within the next week as she starts medication. If her UC is quiescent, would aim for awaiting spontaneous labor or IOL at [redacted] weeks gestation.  -IF medical therapy is ineffective and she will require surgical intervention, we would recommend delivery at that time.  -Check weekly CBC (I sent one today) to ensure no significant drop in hemoglobin that would require transfusion -F/U here with MFM in one week to assess response to therapy and confirm delivery plans.   Total time spent with the patient was 40 minutes with greater than 50% spent in counseling and coordination of care. We appreciate this interesting consult and will be happy to be involved in the ongoing care of Monica Drake in anyway her obstetricians desire.  Artemio AlyBrenna Hughes, MD [facility]Fetal Medicine [facility]

## 2017-11-11 NOTE — Telephone Encounter (Signed)
Patient LVM that she has some questions regarding the medication that she was given today. She will not start them until she hears from you. Please call

## 2017-11-11 NOTE — H&P (Signed)
Wyline MoodKiran Aksel Bencomo, MD [address], [address], [place], [place], [address] [address], [address], [place], [place], [address] Phone: [phone]  Fax: [phone]  Primary Care Physician:  Nadara MustardHarris, Robert P, MD   Pre-Procedure History & Physical: HPI:  Monica Drake is a 37 y.o. female is here for s Drake for rectal bleeding since 05/2017, 5-6 bowel movements a day , cramping and nocturnal symptoms of cramping and urgency.    Past Medical History:  Diagnosis Date  . Foot fracture, right   . Hypoglycemia   . Pregnant and not yet delivered in third trimester     Past Surgical History:  Procedure Laterality Date  . ECTOPIC PREGNANCY SURGERY  02/27/2005  . HERNIA REPAIR      Prior to Admission medications   Medication Sig Start Date End Date Taking? Authorizing Provider  acetaminophen (TYLENOL) 325 MG tablet Take by mouth. 09/14/10   [provider]  hydrocortisone (ANUSOL-HC) 25 MG suppository Place 1 suppository (25 mg total) rectally 2 (two) times daily. Patient not taking: Reported on 11/04/2017 07/15/17   Natale MilchSchuman, Christanna R, MD  prenatal vitamin w/FE, FA (NATACHEW) 29-1 MG CHEW chewable tablet Chew 1 tablet by mouth daily at 12 noon.    [provider]    Allergies as of 11/08/2017  . (Not on File)    Family History  Problem Relation Age of Onset  . Diabetes Mother   . Hypertension Mother   . Arthritis Mother   . Heart disease Mother   . Hypertension Father   . Arthritis Father   . Birth defects Sister     Social History   Socioeconomic History  . Marital status: Married    Spouse name: Not on file  . Number of children: Not on file  . Years of education: Not on file  . Highest education level: Not on file  Occupational History  . Not on file  Social Needs  . Financial resource strain: Not on file  . Food insecurity:    Worry: Not on file    Inability: Not on file  . Transportation needs:    Medical: Not on file   Non-medical: Not on file  Tobacco Use  . Smoking status: Never Smoker  . Smokeless tobacco: Never Used  Substance and Sexual Activity  . Alcohol use: No  . Drug use: No  . Sexual activity: Yes    Birth control/protection: Condom  Lifestyle  . Physical activity:    Days per week: Not on file    Minutes per session: Not on file  . Stress: Not on file  Relationships  . Social connections:    Talks on phone: Not on file    Gets together: Not on file    Attends religious service: Not on file    Active member of club or organization: Not on file    Attends meetings of clubs or organizations: Not on file    Relationship status: Not on file  . Intimate partner violence:    Fear of current or ex partner: Not on file    Emotionally abused: Not on file    Physically abused: Not on file    Forced sexual activity: Not on file  Other Topics Concern  . Not on file  Social History Narrative  . Not on file    Review of Systems: See HPI, otherwise negative ROS  Physical Exam: BP 105/68   Pulse 75   Temp (!) 97 F (  36.1 C) (Tympanic)   Resp 16   Ht 5\' 6"  (1.676 m)   Wt 142 lb (64.4 kg)   LMP 03/05/2017 (Approximate)   SpO2 100%   BMI 22.92 kg/m  General:   Alert,  pleasant and cooperative in NAD Head:  Normocephalic and atraumatic. Neck:  Supple; no masses or thyromegaly. Lungs:  Clear throughout to auscultation, normal respiratory effort.    Heart:  +S1, +S2, Regular rate and rhythm, No edema. Abdomen:  Soft, nontender, distended from pregnancy . Normal bowel sounds, without guarding, and without rebound.   Neurologic:  Alert and  oriented x4;  grossly normal neurologically.  Impression/Plan: Monica Drake  to be performed for rectal bleeding Risks, benefits, limitations, and alternatives regarding  colonoscopy have been reviewed with the patient.  Questions have been answered.  All parties agreeable.   [name], MD  11/11/2017, 8:06  AM

## 2017-11-11 NOTE — Op Note (Signed)
[facility] Patient Name: Monica HildingKimberly Yeomans Procedure Date: 11/11/2017 7:19 AM MRN: [id number] Account #: [id number] Date of Birth: June 25, 1981 Admit Type: Outpatient Age: 37 [facility] Gender: Female Note Status: Finalized Procedure:            Flexible Sigmoidoscopy Providers:            Wyline MoodKiran Janki Dike MD, MD Referring MD:         Shelda PalStefanie G. Schuman (Referring MD) Medicines:            None Complications:        No immediate complications. Procedure:            Pre-Anesthesia Assessment:                       - Prior to the procedure, a History and Physical was                        performed, and patient medications, allergies and                        sensitivities were reviewed. The patient's tolerance of                        previous anesthesia was reviewed.                       - The risks and benefits of the procedure and the                        sedation options and risks were discussed with the                        patient. All questions were answered and informed                        consent was obtained.                       - ASA Grade Assessment: II - A patient with mild                        systemic disease.                       After obtaining informed consent, the scope was passed                        under direct vision. The Endoscope was introduced                        through the anus and advanced to the the left                        transverse colon. The flexible sigmoidoscopy was                        accomplished with ease. The patient tolerated the                        procedure well. The quality of the bowel preparation  was good. Findings:      The perianal and digital rectal examinations were normal.      Inflammation characterized by congestion (edema), erosions, erythema and       loss of vascularity was found in a continuous and circumferential       pattern from  the anus to the transverse colon. This was graded as Mayo       Score 2 (moderate, with marked erythema, absent vascular pattern,       friability, erosions), and when compared to previous examinations, the       findings are new. Biopsies were taken with a cold forceps for histology. Impression:           - Pancolitis. Inflammation was found from the anus to                        the transverse colon. This was graded as Mayo Score 2                        (moderate disease), new compared to previous                        examinations. Biopsied. Recommendation:       - Discharge patient to home (with escort). Procedure Code(s):    --- Professional ---                       [phone], Sigmoidoscopy, flexible; with biopsy, single or                        multiple Diagnosis Code(s):    --- Professional ---                       K52.9, Noninfective gastroenteritis and colitis,                        unspecified CPT copyright 2017 American Medical Association. All rights reserved. The codes documented in this report are preliminary and upon coder review may  be revised to meet current compliance requirements. [name], MD [name] MD, MD 11/11/2017 8:24:53 AM This report has been signed electronically. Number of Addenda: 0 Note Initiated On: 11/11/2017 7:19 AM      [facility]

## 2017-11-12 ENCOUNTER — Telehealth: Payer: Self-pay

## 2017-11-12 ENCOUNTER — Other Ambulatory Visit: Payer: Self-pay

## 2017-11-12 LAB — SURGICAL PATHOLOGY

## 2017-11-12 NOTE — Telephone Encounter (Signed)
Pt states she had a procedure done with Schuman and another Dr, they put her on prednisone. She is worried about the weight gain from the steroid. Is there something else she can take or should she ignore the the cravings? Please advise

## 2017-11-12 NOTE — Telephone Encounter (Signed)
Advised pt per Dr. Jerene PitchSchuman to continue medication and if weight gain is a concern to make sure she is making health choices and drinking plenty of water. Pt aware and appreciative.

## 2017-11-12 NOTE — Progress Notes (Signed)
Thank you for letting me know

## 2017-11-13 ENCOUNTER — Encounter: Payer: Self-pay | Admitting: Gastroenterology

## 2017-11-14 ENCOUNTER — Ambulatory Visit: Payer: Medicaid Other | Admitting: Gastroenterology

## 2017-11-18 ENCOUNTER — Ambulatory Visit (INDEPENDENT_AMBULATORY_CARE_PROVIDER_SITE_OTHER): Payer: Medicaid Other | Admitting: Gastroenterology

## 2017-11-18 ENCOUNTER — Telehealth: Payer: Self-pay | Admitting: Gastroenterology

## 2017-11-18 ENCOUNTER — Encounter: Payer: Self-pay | Admitting: Obstetrics and Gynecology

## 2017-11-18 ENCOUNTER — Encounter: Payer: Self-pay | Admitting: Gastroenterology

## 2017-11-18 ENCOUNTER — Ambulatory Visit
Admit: 2017-11-18 | Discharge: 2017-11-18 | Disposition: A | Discharge status: Home / Self Care | Payer: Medicaid Other | Attending: Obstetrics & Gynecology | Admitting: Obstetrics & Gynecology

## 2017-11-18 ENCOUNTER — Ambulatory Visit (INDEPENDENT_AMBULATORY_CARE_PROVIDER_SITE_OTHER): Payer: Medicaid Other | Admitting: Obstetrics and Gynecology

## 2017-11-18 VITALS — BP 98/63 | HR 86 | Temp 97.8°F | Resp 16 | Ht 66.0 in | Wt 149.0 lb

## 2017-11-18 VITALS — BP 112/68 | Wt 152.0 lb

## 2017-11-18 VITALS — BP 119/75 | HR 96 | Ht 67.0 in | Wt 151.6 lb

## 2017-11-18 DIAGNOSIS — O0993 Supervision of high risk pregnancy, unspecified, third trimester: Secondary | ICD-10-CM

## 2017-11-18 DIAGNOSIS — K5190 Ulcerative colitis, unspecified, without complications: Secondary | ICD-10-CM | POA: Diagnosis not present

## 2017-11-18 DIAGNOSIS — O99013 Anemia complicating pregnancy, third trimester: Secondary | ICD-10-CM | POA: Insufficient documentation

## 2017-11-18 DIAGNOSIS — O99613 Diseases of the digestive system complicating pregnancy, third trimester: Secondary | ICD-10-CM | POA: Insufficient documentation

## 2017-11-18 DIAGNOSIS — K51011 Ulcerative (chronic) pancolitis with rectal bleeding: Secondary | ICD-10-CM | POA: Diagnosis not present

## 2017-11-18 DIAGNOSIS — N2889 Other specified disorders of kidney and ureter: Secondary | ICD-10-CM

## 2017-11-18 DIAGNOSIS — O360990 Maternal care for other rhesus isoimmunization, unspecified trimester, not applicable or unspecified: Secondary | ICD-10-CM

## 2017-11-18 DIAGNOSIS — Z3A36 36 weeks gestation of pregnancy: Secondary | ICD-10-CM | POA: Insufficient documentation

## 2017-11-18 DIAGNOSIS — D500 Iron deficiency anemia secondary to blood loss (chronic): Secondary | ICD-10-CM

## 2017-11-18 HISTORY — DX: Ulcerative colitis, unspecified, without complications: K51.90

## 2017-11-18 LAB — CBC
HCT: 28.1 % — ABNORMAL LOW (ref 35.0–47.0)
Hemoglobin: 9.2 g/dL — ABNORMAL LOW (ref 12.0–16.0)
MCH: 30.3 pg (ref 26.0–34.0)
MCHC: 32.8 g/dL (ref 32.0–36.0)
MCV: 92.1 fL (ref 80.0–100.0)
PLATELETS: 304 10*3/uL (ref 150–440)
RBC: 3.05 MIL/uL — AB (ref 3.80–5.20)
RDW: 14.2 % (ref 11.5–14.5)
WBC: 6.8 10*3/uL (ref 3.6–11.0)

## 2017-11-18 LAB — VIRUS CULTURE

## 2017-11-18 NOTE — ED Notes (Signed)
Patient here for follow up labs and post colon procedure.

## 2017-11-18 NOTE — Progress Notes (Signed)
Routine Prenatal Care Visit  Subjective  Monica Drake is a 37 y.o. Z6X0960 at [redacted]w[redacted]d being seen today for ongoing prenatal care.  She is currently monitored for the following issues for this high-risk pregnancy and has High-risk pregnancy; Grade I hemorrhoids; Anti-E isoimmunization affecting pregnancy, antepartum; Anemia of pregnancy; Right renal mass; and Ulcerative pancolitis with rectal bleeding (HCC) on their problem list.  ----------------------------------------------------------------------------------- Patient reports no complaints.  Patient is feeling better with steroids, her rectal bleeding has improved and nearly resolved. She has stopped having accidents. She is taking prednisone and Lialda.  Contractions: Not present. Vag. Bleeding: None.  Movement: Present. Denies leaking of fluid.  ----------------------------------------------------------------------------------- The following portions of the patient's history were reviewed and updated as appropriate: allergies, current medications, past family history, past medical history, past social history, past surgical history and problem list. Problem list updated.   Objective  Blood pressure 112/68, weight 152 lb (68.9 kg), last menstrual period 03/05/2017, unknown if currently breastfeeding. Pregravid weight 123 lb (55.8 kg) Total Weight Gain 29 lb (13.2 kg) Urinalysis: Urine Protein: Negative Urine Glucose: Negative  Fetal Status: Fetal Heart Rate (bpm): 142 Fundal Height: 35 cm Movement: Present     General:  Alert, oriented and cooperative. Patient is in no acute distress.  Skin: Skin is warm and dry. No rash noted.   Cardiovascular: Normal heart rate noted  Respiratory: Normal respiratory effort, no problems with respiration noted  Abdomen: Soft, gravid, appropriate for gestational age. Pain/Pressure: Absent     Pelvic:  Cervical exam deferred Dilation: Closed Effacement (%): 50 Station: -3  Extremities: Normal range  of motion.     ental Status: Normal mood and affect. Normal behavior. Normal judgment and thought content.     Assessment   37 y.o. [id number] at [redacted]w[redacted]d by  12/10/2017, by Last Menstrual Period presenting for routine prenatal visit  Plan   pregnancy5 Problems (from 03/05/17 to present)    Problem Noted Resolved   Anemia of pregnancy 10/07/2017 by Conard Novak, MD No   Overview Signed 10/07/2017  9:54 AM by Conard Novak, MD    Start iron at 30 weeks      Anti-E isoimmunization affecting pregnancy, antepartum 07/29/2017 by Vena Austria, MD No   Overview Addendum 07/31/2017  4:49 PM by Vena Austria, MD    Anti-E antibody on initial T&S Reportedly posistive since her G2 pregnancy, no history of blood transfusions.  Monitor serial T&S if titer rises >1:16 MCA doppler and DP referral 1:1 titer initial NOB labs to weak to titer follow up 07/29/17      High-risk pregnancy 06/21/2015 by [place] Bing, MD No   Overview Addendum 07/29/2017  4:55 PM by Vena Austria, MD    Clinic [facility] Prenatal Labs  Dating LMP = 10 week [place] Blood type: O/Positive/-- (12/17 1059)   Genetic Screen Declines all genetic screening Antibody:Positive, See Final Results (12/17 1059) Anti-E  Anatomic [place] Complete 07/29/17 Rubella: Immune Varicella: Immune  GTT Early: NA, Third trimester:  RPR: Non Reactive (12/17 1059)   Rhogam N/A HBsAg: Negative (12/17 1059)   TDaP vaccine              Flu Shot:Declines HIV: Non Reactive (12/17 1059)   Baby Food Breast                               GBS:   Contraception Declines Pap: 07/15/17 NIL HPV negative  CBB     CS/VBAC NA   Support Person Alaric Maisie Fushomas                 Gestational age appropriate obstetric precautions including but not limited to vaginal bleeding, contractions, leaking of fluid and fetal movement were reviewed in detail with the patient.    Continue treatment for ulcerative colitis with prednisone and mesalamine. Anemia has  not improved, iron deficient despite oral therapy, will refer to hematology for Iron transfusion therapy.  GBS, GC/CT collected today. Return in about 1 week (around 11/25/2017) for ROB.  Adelene Idlerhristanna Wilkins Elpers MD [facility], [facility] 11/18/2017, 3:27 PM

## 2017-11-18 NOTE — Progress Notes (Signed)
Monica MoodKiran Hashem Goynes MD, MRCP(U.K) [address]  [address]  [place], [place] [address]  Main: [phone]  Fax: [phone]   Primary Care Physician: Nadara MustardHarris, Robert P, MD  Primary Gastroenterologist:  Dr. Wyline MoodKiran Wilfred Drake   No chief complaint on file.   HPI: Monica Drake is a 37 y.o. female    Summary of history : She is here to follow up . She was seen 1 week back with a history of rectal bleeding since 05/2017. She is   [redacted]  weeks pregnant tomorrow.  A flexible sigmoidoscopy performed on 11/11/17 revealed moderate colitis and biopsies confirm ulcerative colitis with no HSV/CMV seen. I commenced her on steroids about 4 days back along with Lialda and rowasa enemas. MRI abdomen showed no abnormalities.  CBC Latest Ref Rng & Units 11/18/2017 11/11/2017 11/05/2017  WBC 3.6 - 11.0 K/uL 6.8 5.0 6.4  Hemoglobin 12.0 - 16.0 g/dL 1.3(Y9.2(L) 8.6(V9.4(L) 7.8(I9.8(L)  Hematocrit 35.0 - 47.0 % 28.1(L) 28.5(L) 29.7(L)  Platelets 150 - 440 K/uL 304 270 289    Interval history   11/11/2017-  11/18/2017  Feels much better, bleeding has almost completely stopped. Not had a bowel movement in 2 days- didn't have a bowel movement since 2 days. Last bowel movement was soft , easy to pass. Very minimal cramping. Wakes up in the night from her pregnancy but not from her GI issues. Energy levels is better, appetite is good.   She is on 35 mg prednisone from tomorrow. She has been seen by high risk OB today and is being planned to deliver around 39-40 weeks . [facility]. Doing the Rowasa and lialda.   Current Outpatient Medications  Medication Sig Dispense Refill  . acetaminophen (TYLENOL) 325 MG tablet Take by mouth.    . hydrocortisone (ANUSOL-HC) 25 MG suppository Place 1 suppository (25 mg total) rectally 2 (two) times daily. (Patient not taking: Reported on 11/04/2017) 12 suppository 1  . mesalamine (LIALDA) 1.2 g EC tablet Take 4 tablets (4.8 g total) by mouth daily with breakfast. 120 tablet 0  .  predniSONE (DELTASONE) 5 MG tablet Take 8 tablets (40 mg total) by mouth daily with breakfast for 7 days, THEN 7 tablets (35 mg total) daily with breakfast for 7 days, THEN 6 tablets (30 mg total) daily with breakfast for 7 days, THEN 5 tablets (25 mg total) daily with breakfast for 7 days, THEN 4 tablets (20 mg total) daily with breakfast for 7 days, THEN 3 tablets (15 mg total) daily with breakfast for 7 days, THEN 2 tablets (10 mg total) daily with breakfast for 7 days, THEN 1 tablet (5 mg total) daily with breakfast for 14 days. 259 tablet 0  . prenatal vitamin w/FE, FA (NATACHEW) 29-1 MG CHEW chewable tablet Chew 1 tablet by mouth daily at 12 noon.     No current facility-administered medications for this visit.     Allergies as of 11/18/2017  . (No Known Allergies)    ROS:  General: Negative for anorexia, weight loss, fever, chills, fatigue, weakness. ENT: Negative for hoarseness, difficulty swallowing , nasal congestion. CV: Negative for chest pain, angina, palpitations, dyspnea on exertion, peripheral edema.  Respiratory: Negative for dyspnea at rest, dyspnea on exertion, cough, sputum, wheezing.  GI: See history of present illness. GU:  Negative for dysuria, hematuria, urinary incontinence, urinary frequency, nocturnal urination.  Endo: Negative for unusual weight change.    Physical Examination:   LMP 03/05/2017 (Approximate)   General: Well-nourished,  well-developed in no acute distress.  Eyes: No icterus. Conjunctivae pink. Mouth: Oropharyngeal mucosa moist and pink , no lesions erythema or exudate. Lungs: Clear to auscultation bilaterally. Non-labored. Heart: Regular rate and rhythm, no murmurs rubs or gallops.  Abdomen: Bowel sounds are normal, nontender, distended due to pregnancy ,no hepatosplenomegaly or masses, no abdominal bruits or hernia , no rebound or guarding.   Extremities: No lower extremity edema. No clubbing or deformities. Neuro: Alert and oriented x 3.   Grossly intact. Skin: Warm and dry, no jaundice.   Psych: Alert and cooperative, normal mood and affect.   Imaging Studies: Mr Pelvis Wo Contrast  Result Date: 11/04/2017 CLINICAL DATA:  37 year old pregnant female at [redacted] weeks gestation. Bloody diarrhea several times a day for several months. EXAM: MRI ABDOMEN AND PELVIS WITHOUT CONTRAST TECHNIQUE: Multiplanar multisequence MR imaging of the abdomen and pelvis was performed. No intravenous contrast was administered. COMPARISON:  None. FINDINGS: COMBINED FINDINGS FOR BOTH MR ABDOMEN AND PELVIS Lower chest: No acute abnormality at the lung bases. Hepatobiliary: Normal liver size and configuration. No hepatic steatosis. No liver mass. No cholelithiasis. Layering gallbladder sludge in the nondistended gallbladder. No gallbladder wall thickening or pericholecystic fluid. No biliary ductal dilatation. Common bile duct diameter 3 mm. No choledocholithiasis. Pancreas: No pancreatic mass or duct dilation.  No pancreas divisum. Spleen: Normal size pancreas. A few tiny T2 hyperintense lesions are scattered in the spleen, largest 0.5 cm (series 11/image 11), which are incompletely characterized on this noncontrast MRI study and are probably benign. Adrenals/Urinary Tract: Normal adrenals. No hydronephrosis. There is a 2.7 x 2.5 cm complex renal cyst in the posterior interpolar right kidney (series 12/image 17) with heterogeneous signal intensity on T2 sequences and isointensity on T1 sequences, incompletely characterized on this noncontrast MRI. No additional renal lesions. Normal collapsed bladder. Stomach/Bowel: Grossly normal stomach. Normal caliber small bowel, with no small bowel wall thickening. Appendix not discretely visualized. No pericecal inflammatory changes. Large volume of stool in the right, transverse and descending colon. Sigmoid colon and rectum are collapsed. No convincing large bowel wall thickening or pericolonic inflammatory changes.  Vascular/Lymphatic: Normal caliber abdominal aorta. No pathologically enlarged lymph nodes in the abdomen or pelvis. Reproductive: Enlarged gravid uterus with single intrauterine gestation in cephalic lie. Uterine cavity extends superiorly to the L2 vertebral level. This MRI study is not tailored for fetal evaluation. Anterior placenta with no evidence of placental abruption or previa. No appreciable uterine fibroids. Cervix length 2.8 cm. No evidence of internal cervical funneling. Amniotic fluid volume appears subjectively normal. Other: No abdominal ascites or focal fluid collection. Musculoskeletal: No aggressive appearing focal osseous lesions. IMPRESSION: 1. Large stool volume in the right, transverse and descending colon, suggesting constipation. Sigmoid colon and rectum are collapsed. No evidence of bowel obstruction or acute bowel inflammation. 2. Complex 2.7 cm renal cyst in the posterior interpolar right kidney, incompletely characterized on this noncontrast MRI study. Statistically in a patient of this age, a hemorrhagic/proteinaceous Bosniak category 2 renal cyst is most likely. Renal neoplasm cannot be strictly excluded on the basis of this scan. Advise postpartum follow-up with either renal ultrasound or MRI abdomen without and with IV contrast. 3. Enlarged gravid uterus with single intrauterine gestation in cephalic lie. This MRI study is not tailored for fetal evaluation. No evidence of acute gestational abnormality. Cervix length 2.8 cm without internal cervical funneling. Subjectively normal amniotic fluid volume. Electronically Signed   By: Delbert Phenix M.D.   On: 11/04/2017 13:35   Mr Abdomen  Wo Contrast  Result Date: 11/04/2017 CLINICAL DATA:  37 year old pregnant female at [redacted] weeks gestation. Bloody diarrhea several times a day for several months. EXAM: MRI ABDOMEN AND PELVIS WITHOUT CONTRAST TECHNIQUE: Multiplanar multisequence MR imaging of the abdomen and pelvis was performed. No  intravenous contrast was administered. COMPARISON:  None. FINDINGS: COMBINED FINDINGS FOR BOTH MR ABDOMEN AND PELVIS Lower chest: No acute abnormality at the lung bases. Hepatobiliary: Normal liver size and configuration. No hepatic steatosis. No liver mass. No cholelithiasis. Layering gallbladder sludge in the nondistended gallbladder. No gallbladder wall thickening or pericholecystic fluid. No biliary ductal dilatation. Common bile duct diameter 3 mm. No choledocholithiasis. Pancreas: No pancreatic mass or duct dilation.  No pancreas divisum. Spleen: Normal size pancreas. A few tiny T2 hyperintense lesions are scattered in the spleen, largest 0.5 cm (series 11/image 11), which are incompletely characterized on this noncontrast MRI study and are probably benign. Adrenals/Urinary Tract: Normal adrenals. No hydronephrosis. There is a 2.7 x 2.5 cm complex renal cyst in the posterior interpolar right kidney (series 12/image 17) with heterogeneous signal intensity on T2 sequences and isointensity on T1 sequences, incompletely characterized on this noncontrast MRI. No additional renal lesions. Normal collapsed bladder. Stomach/Bowel: Grossly normal stomach. Normal caliber small bowel, with no small bowel wall thickening. Appendix not discretely visualized. No pericecal inflammatory changes. Large volume of stool in the right, transverse and descending colon. Sigmoid colon and rectum are collapsed. No convincing large bowel wall thickening or pericolonic inflammatory changes. Vascular/Lymphatic: Normal caliber abdominal aorta. No pathologically enlarged lymph nodes in the abdomen or pelvis. Reproductive: Enlarged gravid uterus with single intrauterine gestation in cephalic lie. Uterine cavity extends superiorly to the L2 vertebral level. This MRI study is not tailored for fetal evaluation. Anterior placenta with no evidence of placental abruption or previa. No appreciable uterine fibroids. Cervix length 2.8 cm. No  evidence of internal cervical funneling. Amniotic fluid volume appears subjectively normal. Other: No abdominal ascites or focal fluid collection. Musculoskeletal: No aggressive appearing focal osseous lesions. IMPRESSION: 1. Large stool volume in the right, transverse and descending colon, suggesting constipation. Sigmoid colon and rectum are collapsed. No evidence of bowel obstruction or acute bowel inflammation. 2. Complex 2.7 cm renal cyst in the posterior interpolar right kidney, incompletely characterized on this noncontrast MRI study. Statistically in a patient of this age, a hemorrhagic/proteinaceous Bosniak category 2 renal cyst is most likely. Renal neoplasm cannot be strictly excluded on the basis of this scan. Advise postpartum follow-up with either renal ultrasound or MRI abdomen without and with IV contrast. 3. Enlarged gravid uterus with single intrauterine gestation in cephalic lie. This MRI study is not tailored for fetal evaluation. No evidence of acute gestational abnormality. Cervix length 2.8 cm without internal cervical funneling. Subjectively normal amniotic fluid volume. Electronically Signed   By: Delbert Phenix M.D.   On: 11/04/2017 13:35    Assessment and Plan:   Monica Drake is a 37 y.o. y/o female who is [redacted] weeks pregnant and diagnosed with ulcerative colitis last week after a sigmoidoscopy for rectal bleeding . On 35 mg prednisone from tomorrow and doing really well. Also on lialda and Rowasa.    Plan  1. Continue steroids - decrease by 5 mg every week .  2. Continue Lialda and Rowasa 3. I will see her just before she hits the 39 week mark and ensure she is on the right dose of steroids prior to delivery and then will see her 2 weeks after to taper her  off the steroids and see if the mesalamine is sufficient to control her symptoms.   She is advised that she can call my office any day to be seen by me  if needed till she delivers.     Dr Monica Mood  MD,MRCP  [facility]) Follow up in 2 weeks

## 2017-11-18 NOTE — Progress Notes (Signed)
[facility] - Follow up Visit  [name] is a 36 year-iold G6 P4014 at 6536 6/7 weeks who returns for follow up visit. Monica Drake developed extensive blood diarrhea and rectal bleeding this pregnancy. She was recently diagnosed with ulcerative colitis and last week started on a steroid taper and mesalamine.  Her last bowel movement was 2 days ago. She is still having a small amount of blood per rectum but is no longer bright red.  She denies any dizziness or palpitations and feels much better.  She has followup with GI today.  -Repeat CBC sent -Continue oral iron therapy (325 TID), steroid taper and mesalamine -[name] is sensitized to E.  She last titer was in December 2018 and was 1:1. Pt left before I saw this. I called [facility] today and they will repeat her titer during her visit this afternoon. Please call if titer is at or greater than 1:16 as we will need to screen for fetal anemia. In addition, contact the blood bank when she is admitted as she is at risk for requiring a transfusion and due to the antibody, will require a longer period of time to identify compatible units.  Tanayia Wahlquist, [place] A, MD  Addendum Repeat CBC shows Hct 28.1, which is stable. She is improving on her steroids and mesalamine. Recommend to continue adequate hydration and TID oral iron. She will also continue her steroid taper and mesalamine per GI.  Recommend weekly fetal testing and delivery in her 39th week.  Repeat CBC weekly.  Kyliee Ortego, [place], MD

## 2017-11-18 NOTE — Addendum Note (Signed)
Addended by: Adelene IdlerSCHUMAN, CHRISTANNA on: 11/18/2017 03:49 PM   Modules accepted: Orders

## 2017-11-18 NOTE — Telephone Encounter (Signed)
I spoke to the patient on Thursday and explained she had moderate severity ulcerative colitis which is a new diagnosis, explained since she has significant rectal bleeding need to get it under control and wll start her on steroids. Will also plan to start on Mesalamine at the same time which is probably the safest drug amongst those available for this condiditon. Discussed side effects and she agreed to proceed. Suggested follow up the following week.  Dr Wyline MoodKiran Mi Balla MD,MRCP [facility](U.K) Gastroenterology/Hepatology Pager: [phone]

## 2017-11-18 NOTE — Progress Notes (Signed)
ROB GBS/Aptima 

## 2017-11-21 ENCOUNTER — Other Ambulatory Visit: Payer: Self-pay

## 2017-11-21 ENCOUNTER — Inpatient Hospital Stay
Admission: EM | Admit: 2017-11-21 | Discharge: 2017-11-23 | DRG: 806 | Disposition: A | Discharge status: Home / Self Care | Payer: Medicaid Other | Attending: Obstetrics and Gynecology | Admitting: Obstetrics and Gynecology

## 2017-11-21 ENCOUNTER — Encounter: Payer: Self-pay | Admitting: Oncology

## 2017-11-21 ENCOUNTER — Inpatient Hospital Stay: Payer: Medicaid Other | Attending: Oncology | Admitting: Oncology

## 2017-11-21 ENCOUNTER — Inpatient Hospital Stay: Payer: Medicaid Other

## 2017-11-21 VITALS — BP 99/63 | HR 80 | Temp 97.8°F | Ht 66.0 in | Wt 153.8 lb

## 2017-11-21 VITALS — BP 107/65 | HR 74 | Temp 97.4°F | Resp 18

## 2017-11-21 DIAGNOSIS — K5190 Ulcerative colitis, unspecified, without complications: Secondary | ICD-10-CM | POA: Diagnosis present

## 2017-11-21 DIAGNOSIS — O4292 Full-term premature rupture of membranes, unspecified as to length of time between rupture and onset of labor: Secondary | ICD-10-CM | POA: Diagnosis present

## 2017-11-21 DIAGNOSIS — D509 Iron deficiency anemia, unspecified: Secondary | ICD-10-CM | POA: Insufficient documentation

## 2017-11-21 DIAGNOSIS — O360930 Maternal care for other rhesus isoimmunization, third trimester, not applicable or unspecified: Secondary | ICD-10-CM | POA: Diagnosis present

## 2017-11-21 DIAGNOSIS — O9081 Anemia of the puerperium: Secondary | ICD-10-CM | POA: Diagnosis not present

## 2017-11-21 DIAGNOSIS — O26893 Other specified pregnancy related conditions, third trimester: Secondary | ICD-10-CM | POA: Diagnosis present

## 2017-11-21 DIAGNOSIS — Z3A39 39 weeks gestation of pregnancy: Secondary | ICD-10-CM | POA: Diagnosis not present

## 2017-11-21 DIAGNOSIS — D508 Other iron deficiency anemias: Secondary | ICD-10-CM | POA: Insufficient documentation

## 2017-11-21 DIAGNOSIS — Z3A37 37 weeks gestation of pregnancy: Secondary | ICD-10-CM

## 2017-11-21 DIAGNOSIS — D62 Acute posthemorrhagic anemia: Secondary | ICD-10-CM | POA: Diagnosis not present

## 2017-11-21 DIAGNOSIS — O4290 Premature rupture of membranes, unspecified as to length of time between rupture and onset of labor, unspecified weeks of gestation: Secondary | ICD-10-CM | POA: Diagnosis present

## 2017-11-21 HISTORY — DX: Iron deficiency anemia, unspecified: D50.9

## 2017-11-21 LAB — CBC
HEMATOCRIT: 28.2 % — AB (ref 35.0–47.0)
HEMOGLOBIN: 9.3 g/dL — AB (ref 12.0–16.0)
MCH: 30.4 pg (ref 26.0–34.0)
MCHC: 32.9 g/dL (ref 32.0–36.0)
MCV: 92.3 fL (ref 80.0–100.0)
Platelets: 334 10*3/uL (ref 150–440)
RBC: 3.06 MIL/uL — AB (ref 3.80–5.20)
RDW: 14.8 % — ABNORMAL HIGH (ref 11.5–14.5)
WBC: 9.9 10*3/uL (ref 3.6–11.0)

## 2017-11-21 LAB — RAPID HIV SCREEN (HIV 1/2 AB+AG)
HIV 1/2 ANTIBODIES: NONREACTIVE
HIV-1 P24 ANTIGEN - HIV24: NONREACTIVE

## 2017-11-21 MED ORDER — OXYTOCIN BOLUS FROM INFUSION
500.0000 mL | Freq: Once | INTRAVENOUS | Status: AC
  Administered 2017-11-22: 500 mL via INTRAVENOUS

## 2017-11-21 MED ORDER — ONDANSETRON HCL 4 MG/2ML IJ SOLN: 4.0000 mg | Freq: Four times a day (QID) | INTRAMUSCULAR | Status: DC | PRN

## 2017-11-21 MED ORDER — SOD CITRATE-CITRIC ACID 500-334 MG/5ML PO SOLN: 30.0000 mL | ORAL | Status: DC | PRN

## 2017-11-21 MED ORDER — OXYTOCIN 40 UNITS IN LACTATED RINGERS INFUSION - SIMPLE MED: 2.5000 [IU]/h | INTRAVENOUS | Status: DC

## 2017-11-21 MED ORDER — LACTATED RINGERS IV SOLN
INTRAVENOUS | Status: DC
  Administered 2017-11-21: 22:00:00 via INTRAVENOUS

## 2017-11-21 MED ORDER — SODIUM CHLORIDE 0.9 % IV SOLN
5.0000 10*6.[IU] | Freq: Once | INTRAVENOUS | Status: AC
  Administered 2017-11-21: 5 10*6.[IU] via INTRAVENOUS
  Filled 2017-11-21: qty 5

## 2017-11-21 MED ORDER — LACTATED RINGERS IV SOLN: 500.0000 mL | INTRAVENOUS | Status: DC | PRN

## 2017-11-21 MED ORDER — MISOPROSTOL 200 MCG PO TABS
ORAL_TABLET | ORAL | Status: AC
  Administered 2017-11-22: 800 ug via RECTAL
  Filled 2017-11-21: qty 4

## 2017-11-21 MED ORDER — AMMONIA AROMATIC IN INHA
RESPIRATORY_TRACT | Status: AC
  Filled 2017-11-21: qty 10

## 2017-11-21 MED ORDER — LIDOCAINE HCL (PF) 1 % IJ SOLN
30.0000 mL | INTRAMUSCULAR | Status: DC | PRN
  Administered 2017-11-22: 30 mL via SUBCUTANEOUS

## 2017-11-21 MED ORDER — SODIUM CHLORIDE 0.9 % IV SOLN
Freq: Once | INTRAVENOUS | Status: AC
  Administered 2017-11-21: 10:00:00 via INTRAVENOUS
  Filled 2017-11-21: qty 1000

## 2017-11-21 MED ORDER — OXYTOCIN 10 UNIT/ML IJ SOLN
INTRAMUSCULAR | Status: AC
  Filled 2017-11-21: qty 2

## 2017-11-21 MED ORDER — IRON SUCROSE 20 MG/ML IV SOLN
200.0000 mg | Freq: Once | INTRAVENOUS | Status: AC
  Administered 2017-11-21: 200 mg via INTRAVENOUS
  Filled 2017-11-21: qty 10

## 2017-11-21 MED ORDER — PENICILLIN G POT IN DEXTROSE 60000 UNIT/ML IV SOLN
3.0000 10*6.[IU] | INTRAVENOUS | Status: DC
  Administered 2017-11-22: 3 10*6.[IU] via INTRAVENOUS
  Filled 2017-11-21 (×4): qty 50

## 2017-11-21 MED ORDER — LIDOCAINE HCL (PF) 1 % IJ SOLN
INTRAMUSCULAR | Status: AC
  Administered 2017-11-22: 30 mL via SUBCUTANEOUS
  Filled 2017-11-21: qty 30

## 2017-11-21 MED ORDER — OXYTOCIN 40 UNITS IN LACTATED RINGERS INFUSION - SIMPLE MED
INTRAVENOUS | Status: AC
  Administered 2017-11-22: 1 m[IU]/min via INTRAVENOUS
  Filled 2017-11-21: qty 1000

## 2017-11-21 NOTE — Patient Instructions (Signed)
Iron Sucrose injection What is this medicine? IRON SUCROSE (AHY ern SOO krohs) is an iron complex. Iron is used to make healthy red blood cells, which carry oxygen and nutrients throughout the body. This medicine is used to treat iron deficiency anemia in people with chronic kidney disease. This medicine may be used for other purposes; ask your health care provider or pharmacist if you have questions. COMMON BRAND NAME(S): Venofer What should I tell my health care provider before I take this medicine? They need to know if you have any of these conditions: -anemia not caused by low iron levels -heart disease -high levels of iron in the blood -kidney disease -liver disease -an unusual or allergic reaction to iron, other medicines, foods, dyes, or preservatives -pregnant or trying to get pregnant -breast-feeding How should I use this medicine? This medicine is for infusion into a vein. It is given by a health care professional in a hospital or clinic setting. Talk to your pediatrician regarding the use of this medicine in children. While this drug may be prescribed for children as young as 2 years for selected conditions, precautions do apply. Overdosage: If you think you have taken too much of this medicine contact a poison control center or emergency room at once. NOTE: This medicine is only for you. Do not share this medicine with others. What if I miss a dose? It is important not to miss your dose. Call your doctor or health care professional if you are unable to keep an appointment. What may interact with this medicine? Do not take this medicine with any of the following medications: -deferoxamine -dimercaprol -other iron products This medicine may also interact with the following medications: -chloramphenicol -deferasirox This list may not describe all possible interactions. Give your health care provider a list of all the medicines, herbs, non-prescription drugs, or dietary  supplements you use. Also tell them if you smoke, drink alcohol, or use illegal drugs. Some items may interact with your medicine. What should I watch for while using this medicine? Visit your doctor or healthcare professional regularly. Tell your doctor or healthcare professional if your symptoms do not start to get better or if they get worse. You may need blood work done while you are taking this medicine. You may need to follow a special diet. Talk to your doctor. Foods that contain iron include: whole grains/cereals, dried fruits, beans, or peas, leafy green vegetables, and organ meats (liver, kidney). What side effects may I notice from receiving this medicine? Side effects that you should report to your doctor or health care professional as soon as possible: -allergic reactions like skin rash, itching or hives, swelling of the face, lips, or tongue -breathing problems -changes in blood pressure -cough -fast, irregular heartbeat -feeling faint or lightheaded, falls -fever or chills -flushing, sweating, or hot feelings -joint or muscle aches/pains -seizures -swelling of the ankles or feet -unusually weak or tired Side effects that usually do not require medical attention (report to your doctor or health care professional if they continue or are bothersome): -diarrhea -feeling achy -headache -irritation at site where injected -nausea, vomiting -stomach upset -tiredness This list may not describe all possible side effects. Call your doctor for medical advice about side effects. You may report side effects to FDA at 1-800-FDA-1088. Where should I keep my medicine? This drug is given in a hospital or clinic and will not be stored at home. NOTE: This sheet is a summary. It may not cover all possible information. If   you have questions about this medicine, talk to your doctor, pharmacist, or health care provider.  2018 Elsevier/Gold Standard (2011-04-26 17:14:35)  

## 2017-11-21 NOTE — Progress Notes (Signed)
Hematology/Oncology Consult note [facility] Telephone:([phone] [phone]   Patient Care Team: Nadara Mustard, MD as PCP - General (Obstetrics and Gynecology)  REFERRING PROVIDER: Dr.Schuman CHIEF COMPLAINTS/PURPOSE OF CONSULTATION:  Evaluation of anemia.  HISTORY OF PRESENTING ILLNESS:  Monica Drake is a  37 y.o.  female with PMH listed below who was referred to me for evaluation of anemia. Patient is [redacted] weeks pregnant and this is her fifth child.  Reports pregnancy being non-complicated.  Plan induction on Dec 02, 2017. History of iron deficiency: Reports history of iron deficiency in the past. Rectal bleeding: Denies Menstrual bleeding/ Vaginal bleeding : Denies Hematemesis or hemoptysis : Denies Blood in urine : Denies Pica: Denies Last endoscopy: Never had  Patient reports feeling fatigued, mild shortness of breath with exertion.   Review of Systems  Constitutional: Positive for malaise/fatigue. Negative for chills, fever and weight loss.  HENT: Negative for congestion, ear discharge, ear pain, nosebleeds, sinus pain and sore throat.   Eyes: Negative for double vision, photophobia, pain, discharge and redness.  Respiratory: Negative for cough, hemoptysis, sputum production and wheezing.        Mild shortness of breath with exertion.  Cardiovascular: Negative for chest pain, palpitations, orthopnea, claudication and leg swelling.  Gastrointestinal: Negative for abdominal pain, blood in stool, constipation, diarrhea, heartburn, melena, nausea and vomiting.  Genitourinary: Negative for dysuria, flank pain, frequency and hematuria.  Musculoskeletal: Negative for back pain, myalgias and neck pain.       She twisted her right foot recently.  Skin: Negative for itching and rash.  Neurological: Negative for dizziness, tingling, tremors, focal weakness, weakness and headaches.  Endo/Heme/Allergies: Negative for environmental allergies.  Does not bruise/bleed easily.  Psychiatric/Behavioral: Negative for depression, hallucinations, substance abuse and suicidal ideas. The patient is not nervous/anxious.     MEDICAL HISTORY:  Past Medical History:  Diagnosis Date  . Foot fracture, right   . Hypoglycemia   . Iron deficiency anemia 11/21/2017  . Pregnant and not yet delivered in third trimester   . Ulcerative colitis (HCC) 11/11/2017    SURGICAL HISTORY: Past Surgical History:  Procedure Laterality Date  . ECTOPIC PREGNANCY SURGERY  02/27/2005  . FLEXIBLE SIGMOIDOSCOPY N/A 11/11/2017   Procedure: FLEXIBLE SIGMOIDOSCOPY;  Surgeon: [name], MD;  Location: [facility] END[facility]COPY;  Service: Gastroenterology;  Laterality: N/A;  Please use tap water enema prior to procedure. Note: Patient is hypoglycemic  . HERNIA REPAIR      SOCIAL HISTORY: Social History   Socioeconomic History  . Marital status: Married    Spouse name: Not on file  . Number of children: Not on file  . Years of education: Not on file  . Highest education level: Not on file  Occupational History  . Not on file  Social Needs  . Financial resource strain: Not on file  . Food insecurity:    Worry: Not on file    Inability: Not on file  . Transportation needs:    Medical: Not on file    Non-medical: Not on file  Tobacco Use  . Smoking status: Never Smoker  . Smokeless tobacco: Never Used  Substance and Sexual Activity  . Alcohol use: No  . Drug use: No  . Sexual activity: Yes    Birth control/protection: Condom  Lifestyle  . Physical activity:    Days per week: Not on file    Minutes per session: Not on file  . Stress: Not on file  Relationships  .  Social connections:    Talks on phone: Not on file    Gets together: Not on file    Attends religious service: Not on file    Active member of club or organization: Not on file    Attends meetings of clubs or organizations: Not on file    Relationship status: Not on file  . Intimate partner  violence:    Fear of current or ex partner: Not on file    Emotionally abused: Not on file    Physically abused: Not on file    Forced sexual activity: Not on file  Other Topics Concern  . Not on file  Social History Narrative  . Not on file    FAMILY HISTORY: Family History  Problem Relation Age of Onset  . Diabetes Mother   . Hypertension Mother   . Arthritis Mother   . Heart disease Mother   . Hypertension Father   . Arthritis Father   . Birth defects Sister     ALLERGIES:  has No Known Allergies.  MEDICATIONS:  Current Outpatient Medications  Medication Sig Dispense Refill  . acetaminophen (TYLENOL) 325 MG tablet Take by mouth.    . mesalamine (LIALDA) 1.2 g EC tablet Take 4 tablets (4.8 g total) by mouth daily with breakfast. 120 tablet 0  . predniSONE (DELTASONE) 5 MG tablet Take 8 tablets (40 mg total) by mouth daily with breakfast for 7 days, THEN 7 tablets (35 mg total) daily with breakfast for 7 days, THEN 6 tablets (30 mg total) daily with breakfast for 7 days, THEN 5 tablets (25 mg total) daily with breakfast for 7 days, THEN 4 tablets (20 mg total) daily with breakfast for 7 days, THEN 3 tablets (15 mg total) daily with breakfast for 7 days, THEN 2 tablets (10 mg total) daily with breakfast for 7 days, THEN 1 tablet (5 mg total) daily with breakfast for 14 days. 259 tablet 0  . prenatal vitamin w/FE, FA (NATACHEW) 29-1 MG CHEW chewable tablet Chew 1 tablet by mouth daily at 12 noon.     No current facility-administered medications for this visit.      PHYSICAL EXAMINATION: ECOG PERFORMANCE STATUS: 1 - Symptomatic but completely ambulatory Vitals:   11/21/17 0949  BP: 99/63  Pulse: 80  Temp: 97.8 F (36.6 C)   Filed Weights   11/21/17 0949  Weight: 153 lb 12.3 oz (69.7 kg)    Physical Exam  Constitutional: She is oriented to person, place, and time. She appears well-developed and well-nourished. No distress.  HENT:  Head: Normocephalic and atraumatic.   Right Ear: External ear normal.  Left Ear: External ear normal.  Mouth/Throat: Oropharynx is clear and moist.  Eyes: Pupils are equal, round, and reactive to light. EOM are normal. No scleral icterus.  Neck: Normal range of motion. Neck supple.  Cardiovascular: Normal rate, regular rhythm and normal heart sounds.  Pulmonary/Chest: Effort normal and breath sounds normal. No respiratory distress. She has no wheezes. She has no rales. She exhibits no tenderness.  Abdominal: Soft. Bowel sounds are normal. She exhibits no distension and no mass. There is no tenderness.  Gravid uterus   Musculoskeletal: Normal range of motion. She exhibits no edema or deformity.  Lymphadenopathy:    She has no cervical adenopathy.  Neurological: She is alert and oriented to person, place, and time. No cranial nerve deficit. Coordination normal.  Skin: Skin is warm and dry. No rash noted.  Psychiatric: She has a normal mood and  affect. Her behavior is normal. Thought content normal.     LABORATORY DATA:  I have reviewed the data as listed Lab Results  Component Value Date   WBC 6.8 11/18/2017   HGB 9.2 (L) 11/18/2017   HCT 28.1 (L) 11/18/2017   MCV 92.1 11/18/2017   PLT 304 11/18/2017   Recent Labs    11/05/17 1006  NA 138  K 4.1  CL 104  CO2 21  GLUCOSE 72  BUN 7  CREATININE 0.51*  CALCIUM 8.7  GFRNONAA 126  GFRAA 145  PROT 5.8*  ALBUMIN 3.3*  AST 19  ALT 12  ALKPHOS 107  BILITOT <0.2   Iron/TIBC/Ferritin/ %Sat    Component Value Date/Time   FERRITIN 5 (L) 11/11/2017 1000     ASSESSMENT & PLAN:  1. Other iron deficiency anemia    Lab results were reviewed and discussed with patient.  Ferritin is very low at 5.  I recommend IV iron with Venofer 200 mg today, on 4/29 and 11/28/2017.  Advised patient also had the first dose after she delivers. Allergy reactions/infusion reaction including anaphylactic reaction discussed with patient. Patient voices understanding and willing to  proceed.   Repeat iron TIBC CBC prior to next visit in 6 weeks.  All questions were answered. The patient knows to call the clinic with any problems questions or concerns.  Return of visit: 6 weeks Thank you for this kind referral and the opportunity to participate in the care of this patient. A copy of today's note is routed to referring provider    Rickard Patience, MD, PhD Hematology Oncology [facility] Pager- [phone] 11/21/2017

## 2017-11-21 NOTE — H&P (Addendum)
History and Physical    Monica Drake is an 37 y.o. female.  HPI:  Monica GentryKimberley Drake presents today complaining of rupture of membranes at 6pm today. Clear fluid. Patient is having some contractions but they are infrequent, check by nursing found her to be dilated to 3cm. She is reporting good fetal movement. Her pregnancy has been complicated by anemia, Anti-E isoimmunization, and a new diagnosis of ulcerative colitis.   pregnancy5 Problems (from 03/05/17 to present)    Problem Noted Resolved   Anemia of pregnancy 10/07/2017 by Monica NovakJackson, Stephen D, MD No         Anti-E isoimmunization affecting pregnancy, antepartum 07/29/2017 by Monica Drake, Andreas, MD No   Overview Addendum 07/31/2017  4:49 PM by Monica Drake, Andreas, MD    Anti-E antibody on initial T&S Reportedly posistive since her G2 pregnancy, no history of blood transfusions.  Monitor serial T&S if titer rises >1:16 MCA doppler and DP referral 1:1 titer initial NOB labs to weak to titer follow up 07/29/17      High-risk pregnancy 06/21/2015 by Monica Drake, Charlie, MD No   Overview Addendum 07/29/2017  4:55 PM by Monica Drake, Andreas, MD    Clinic [facility] Prenatal Labs  Dating LMP = 10 week US Blood type: O/Positive/-- (12/17 1059)   Genetic Screen Declines all genetic screening Antibody:Positive, See Final Results (12/17 1059) Anti-E  Anatomic US Complete 07/29/17 Rubella: Immune Varicella: Immune  GTT Early: NA, Third trimester:  RPR: Non Reactive (12/17 1059)   Rhogam N/A HBsAg: Negative (12/17 1059)   TDaP vaccine              Flu Shot:Declines HIV: Non Reactive (12/17 1059)   Baby Food Breast                               GBS:   Contraception Declines Pap: 07/15/17 NIL HPV negative  CBB     CS/VBAC NA   Support Person Monica Drake                 OB History  Gravida Para Term Preterm AB Living  6 4 4   1 4   SAB TAB Ectopic Multiple Live Births  1     0 4    # Outcome Date GA Lbr Len/2nd Weight Sex Delivery Anes PTL Lv   6 Current           5 Term 06/21/15 [id number] 01:23 / 00:05 7 lb 6.9 oz (3.37 kg) M Vag-Spont Local  LIV  4 Term 10/21/13    M Vag-Spont EPI N LIV  3 SAB 06/20/12          2 Term 12/13/11    F Vag-Spont EPI N LIV  1 Term 11/15/05    F Vag-Spont EPI N LIV     Past Medical History:  Diagnosis Date  . Foot fracture, right   . Hypoglycemia   . Iron deficiency anemia 11/21/2017  . Pregnant and not yet delivered in third trimester   . Ulcerative colitis (HCC) 11/11/2017    Past Surgical History:  Procedure Laterality Date  . ECTOPIC PREGNANCY SURGERY  02/27/2005  . FLEXIBLE SIGMOIDOSCOPY N/A 11/11/2017   Procedure: FLEXIBLE SIGMOIDOSCOPY;  Surgeon: Monica Drake, Kiran, MD;  [facility];  Service: Gastroenterology;  Laterality: N/A;  Please use tap water enema prior to procedure. Note: Patient is hypoglycemic  . HERNIA REPAIR      Family History  Problem Relation  Age of Onset  . Diabetes Mother   . Hypertension Mother   . Arthritis Mother   . Heart disease Mother   . Hypertension Father   . Arthritis Father   . Birth defects Sister     Social History:  reports that she has never smoked. She has never used smokeless tobacco. She reports that she does not drink alcohol or use drugs.  Allergies: No Known Allergies  Medications: I have reviewed the patient's current medications.  No results found for this or any previous visit (from the past 48 hour(s)).  No results found.  Review of Systems  Constitutional: Negative for chills, fever, malaise/fatigue and weight loss.  HENT: Negative for congestion, hearing loss and sinus pain.   Eyes: Negative for blurred vision and double vision.  Respiratory: Negative for cough, sputum production, shortness of breath and wheezing.   Cardiovascular: Negative for chest pain, palpitations, orthopnea and leg swelling.  Gastrointestinal: Negative for abdominal pain, constipation, diarrhea, nausea and vomiting.  Genitourinary: Negative for  dysuria, flank pain, frequency, hematuria and urgency.  Musculoskeletal: Negative for back pain, falls and joint pain.  Skin: Negative for itching and rash.  Neurological: Negative for dizziness and headaches.  Psychiatric/Behavioral: Positive for depression. Negative for substance abuse and suicidal ideas. The patient is not nervous/anxious.    Blood pressure 111/70, pulse 76, temperature 97.8 F (36.6 C), temperature source Oral, resp. rate 16, height 5\' 6"  (1.676 m), weight 153 lb (69.4 kg), last menstrual period 03/05/2017, unknown if currently breastfeeding. Physical Exam  Nursing note and vitals reviewed. Constitutional: She is oriented to person, place, and time. She appears well-developed and well-nourished.  HENT:  Head: Normocephalic and atraumatic.  Cardiovascular: Normal rate and regular rhythm.  Respiratory: Breath sounds normal. No respiratory distress. She has no wheezes.  GI: Soft. Bowel sounds are normal.  Musculoskeletal: Normal range of motion.  Neurological: She is alert and oriented to person, place, and time.  Skin: Skin is warm and dry.  Psychiatric: She has a normal mood and affect. Her behavior is normal. Judgment and thought content normal.   NST: 120s, moderate variability, no decelerations, moderat variability, 15x15 accelerations present.  Toco:q5-8 minutes   Assessment/Plan: 36yo [id number] at 37 weeks 2 days gestation with premature rupture of membranes 1. Expectant management of labor, will add pitocin if needed  2. GBS status unknown, PCN for GBS prophylaxis  3. Anemia- patient is being managed with Hematology outpatient, had IV iron today.  4. Ulcerative colitis, Dr. Sharlet Salina notified of patient's labor status, will continue home medications for ulcerative colitis.   Valeriano Bain R Tracen Mahler 11/21/2017, 9:34 PM

## 2017-11-21 NOTE — Progress Notes (Signed)
Patient here today as a new patient with anemia.  Patient is pregnant with her 5th child, due on 12/10/17.

## 2017-11-22 DIAGNOSIS — O4292 Full-term premature rupture of membranes, unspecified as to length of time between rupture and onset of labor: Secondary | ICD-10-CM

## 2017-11-22 DIAGNOSIS — Z3A37 37 weeks gestation of pregnancy: Secondary | ICD-10-CM

## 2017-11-22 LAB — CBC
HEMATOCRIT: 26.7 % — AB (ref 35.0–47.0)
HEMOGLOBIN: 8.7 g/dL — AB (ref 12.0–16.0)
MCH: 30.2 pg (ref 26.0–34.0)
MCHC: 32.6 g/dL (ref 32.0–36.0)
MCV: 92.6 fL (ref 80.0–100.0)
Platelets: 282 10*3/uL (ref 150–440)
RBC: 2.88 MIL/uL — AB (ref 3.80–5.20)
RDW: 14.4 % (ref 11.5–14.5)
WBC: 13 10*3/uL — AB (ref 3.6–11.0)

## 2017-11-22 LAB — GC/CHLAMYDIA PROBE AMP
CHLAMYDIA, DNA PROBE: NEGATIVE
Neisseria gonorrhoeae by PCR: NEGATIVE

## 2017-11-22 LAB — CULTURE, BETA STREP (GROUP B ONLY): STREP GP B CULTURE: NEGATIVE

## 2017-11-22 MED ORDER — COCONUT OIL OIL
1.0000 "application " | TOPICAL_OIL | Status: DC | PRN
  Administered 2017-11-22: 1 via TOPICAL
  Filled 2017-11-22: qty 120

## 2017-11-22 MED ORDER — TERBUTALINE SULFATE 1 MG/ML IJ SOLN: 0.2500 mg | Freq: Once | INTRAMUSCULAR | Status: DC | PRN

## 2017-11-22 MED ORDER — ONDANSETRON HCL 4 MG/2ML IJ SOLN: 4.0000 mg | INTRAMUSCULAR | Status: DC | PRN

## 2017-11-22 MED ORDER — SIMETHICONE 80 MG PO CHEW: 80.0000 mg | CHEWABLE_TABLET | ORAL | Status: DC | PRN

## 2017-11-22 MED ORDER — LACTATED RINGERS IV SOLN: INTRAVENOUS | Status: DC

## 2017-11-22 MED ORDER — BENZOCAINE-MENTHOL 20-0.5 % EX AERO: 1.0000 "application " | INHALATION_SPRAY | CUTANEOUS | Status: DC | PRN

## 2017-11-22 MED ORDER — ONDANSETRON HCL 4 MG PO TABS: 4.0000 mg | ORAL_TABLET | ORAL | Status: DC | PRN

## 2017-11-22 MED ORDER — MESALAMINE 1.2 G PO TBEC
4.8000 g | DELAYED_RELEASE_TABLET | Freq: Every day | ORAL | Status: DC
  Administered 2017-11-22 – 2017-11-23 (×2): 4.8 g via ORAL
  Filled 2017-11-22 (×2): qty 4

## 2017-11-22 MED ORDER — WITCH HAZEL-GLYCERIN EX PADS: 1.0000 "application " | MEDICATED_PAD | CUTANEOUS | Status: DC | PRN

## 2017-11-22 MED ORDER — DIPHENHYDRAMINE HCL 25 MG PO CAPS: 25.0000 mg | ORAL_CAPSULE | Freq: Four times a day (QID) | ORAL | Status: DC | PRN

## 2017-11-22 MED ORDER — IBUPROFEN 600 MG PO TABS
600.0000 mg | ORAL_TABLET | Freq: Four times a day (QID) | ORAL | Status: DC
  Filled 2017-11-22 (×2): qty 1

## 2017-11-22 MED ORDER — PREDNISONE 5 MG PO TABS
35.0000 mg | ORAL_TABLET | Freq: Every day | ORAL | Status: DC
  Administered 2017-11-22 – 2017-11-23 (×2): 35 mg via ORAL
  Filled 2017-11-22 (×2): qty 1

## 2017-11-22 MED ORDER — OXYTOCIN 40 UNITS IN LACTATED RINGERS INFUSION - SIMPLE MED
1.0000 m[IU]/min | INTRAVENOUS | Status: DC
  Administered 2017-11-22: 1 m[IU]/min via INTRAVENOUS
  Filled 2017-11-22: qty 1000

## 2017-11-22 MED ORDER — ACETAMINOPHEN 325 MG PO TABS
ORAL_TABLET | ORAL | Status: AC
  Administered 2017-11-22: 650 mg via ORAL
  Filled 2017-11-22: qty 2

## 2017-11-22 MED ORDER — ACETAMINOPHEN 325 MG PO TABS
650.0000 mg | ORAL_TABLET | ORAL | Status: DC | PRN
  Administered 2017-11-22 – 2017-11-23 (×3): 650 mg via ORAL
  Filled 2017-11-22 (×2): qty 2

## 2017-11-22 MED ORDER — PRENATAL MULTIVITAMIN CH
1.0000 | ORAL_TABLET | Freq: Every day | ORAL | Status: DC
  Administered 2017-11-22 – 2017-11-23 (×2): 1 via ORAL
  Filled 2017-11-22 (×2): qty 1

## 2017-11-22 MED ORDER — MISOPROSTOL 200 MCG PO TABS
800.0000 ug | ORAL_TABLET | Freq: Once | ORAL | Status: AC
  Administered 2017-11-22: 800 ug via RECTAL

## 2017-11-22 MED ORDER — DIBUCAINE 1 % RE OINT: 1.0000 "application " | TOPICAL_OINTMENT | RECTAL | Status: DC | PRN

## 2017-11-22 NOTE — Progress Notes (Signed)
Patient ID: Aniceto BossKimberly M Sabel, female   DOB: Dec 21, 1980, 37 y.o.   MRN: [id number]  Obstetric Postpartum Daily Progress Note Subjective:  37 y.o. [id number] postpartum day #0 status post vaginal delivery.  She is ambulating, is tolerating po, is voiding spontaneously.  Her pain is well controlled on PO pain medications. Her lochia is less than menses.   Medications SCHEDULED MEDICATIONS  . ibuprofen  600 mg Oral Q6H  . mesalamine  4.8 g Oral Q breakfast  . predniSONE  35 mg Oral Q breakfast  . prenatal multivitamin  1 tablet Oral Q1200    MEDICATION INFUSIONS  . lactated ringers      PRN MEDICATIONS  acetaminophen, benzocaine-Menthol, coconut oil, witch hazel-glycerin **AND** dibucaine, diphenhydrAMINE, lidocaine (PF), ondansetron **OR** ondansetron (ZOFRAN) IV, simethicone    Objective:   Vitals:   11/22/17 0453 11/22/17 0508 11/22/17 0602 11/22/17 0800  BP: 118/63 124/70 104/68 101/64  Pulse: 87 80 75 64  Resp:   18 20  Temp:   98.5 F (36.9 C) 98.6 F (37 C)  TempSrc:   Oral Oral  SpO2:   98% 99%  Weight:      Height:        Current Vital Signs 24h Vital Sign Ranges  T 98.6 F (37 C) Temp  Avg: 98.1 F (36.7 C)  Min: 97.6 F (36.4 C)  Max: 98.6 F (37 C)  BP 101/64 BP  Min: 100/65  Max: 129/77  HR 64 Pulse  Avg: 69.7  Min: 57  Max: 87  RR 20 Resp  Avg: 17.2  Min: 16  Max: 20  SaO2 99 % Room Air SpO2  Avg: 98.5 %  Min: 98 %  Max: 99 %       24 Hour I/O Current Shift I/O  Time Ins Outs 04/25 0701 - 04/26 0700 In: -  Out: 905  04/26 0701 - 04/26 1900 In: 360 [P.O.:360] Out: -   General: NAD Pulmonary: no increased work of breathing Abdomen: non-distended, non-tender, fundus firm at level of umbilicus Extremities: no edema, no erythema, no tenderness  Labs:  Recent Labs  Lab 11/18/17 1105 11/21/17 2222 11/22/17 0747  WBC 6.8 9.9 13.0*  HGB 9.2* 9.3* 8.7*  HCT 28.1* 28.2* 26.7*  PLT 304 334 282     Assessment:   36 y.o. [id number] postpartum day # 0  status post SVD  Plan:   1) Acute blood loss anemia - hemodynamically stable and asymptomatic - po ferrous sulfate  2) O POS / Rubella 2.27 (12/17 1059)/ Varicella Immune  3) TDAP status declined: influenza vaccine: declined  4) breast feeding /Contraception = natural family planning (NFP)  5) Disposition: home PPD#1-2  Thomasene MohairStephen Millenia Waldvogel, MD 11/22/2017 11:47 AM

## 2017-11-22 NOTE — Lactation Note (Signed)
This note was copied from a baby's chart. Lactation Consultation Note  Patient Name: Monica Drake's Date: 11/22/2017 Reason for consult: Initial assessment Mom has no questions or concerns, has breastfed  Several babies sucessfully Maternal Data Formula Feeding for Exclusion: No Does the patient have breastfeeding experience prior to this delivery?: Yes  Feeding Feeding Type: Breast Fed Length of feed: 10 min(per mom)  LATCH Score Latch: (did not observe feeding)                 Interventions    Lactation Tools Discussed/Used WIC Program: No   Consult Status Consult Status: PRN    Dyann KiefMarsha D Deniya Craigo 11/22/2017, 3:23 PM

## 2017-11-22 NOTE — Progress Notes (Signed)
Discussed augmentation of labor with pitocin on the phone with Cala BradfordKimberly for 25 minutes. She would like to avoid augmentation and is very hesitant. She would like to walk and ambulate first. I encourage Cala BradfordKimberly to proceed with augmentation of labor with pitocin to shorten her labor length since she has already had an exhaustive antepartum and extremely anemic. I believe that it would be in her benefit to augment her labor and would help minimize her risk for hemorrhage, infection, extreme fatique and other labor complications. She would like to discuss with her husband and then decide.  Adelene Idlerhristanna Anakaren Campion MD [facility], [facility] 11/22/17 12:51 AM

## 2017-11-22 NOTE — Progress Notes (Signed)
  GI note  I was informed about her admission. From GI point of view no diarrhea, no rectal bleeding . She appears to be clinically in remission from her ulcerative colitis. Baby doing well.  Suggest  1. Continue prednisone 35 mg and drop by 5 mg every week 2. Continue Lialda and rowasa enemas daily  3. See me in the office in 1-2 weeks and will plan a quick taper of steroids   Please feel free to call me with any questions  Dr Wyline MoodKiran Narissa Beaufort MD,MRCP [facility] Gastroenterology/Hepatology Pager: [phone]

## 2017-11-23 LAB — CBC
HEMATOCRIT: 27.3 % — AB (ref 35.0–47.0)
HEMOGLOBIN: 9.5 g/dL — AB (ref 12.0–16.0)
MCH: 32.3 pg (ref 26.0–34.0)
MCHC: 34.9 g/dL (ref 32.0–36.0)
MCV: 92.6 fL (ref 80.0–100.0)
Platelets: 273 10*3/uL (ref 150–440)
RBC: 2.95 MIL/uL — ABNORMAL LOW (ref 3.80–5.20)
RDW: 14.5 % (ref 11.5–14.5)
WBC: 10.8 10*3/uL (ref 3.6–11.0)

## 2017-11-23 LAB — TYPE AND SCREEN
ABO/RH(D): O POS
Antibody Screen: POSITIVE

## 2017-11-23 LAB — RPR: RPR Ser Ql: NONREACTIVE

## 2017-11-23 MED ORDER — ACETAMINOPHEN 325 MG PO TABS: 650.0000 mg | ORAL_TABLET | ORAL | 1 refills | Status: DC | PRN

## 2017-11-23 MED ORDER — FERROUS FUMARATE 325 (106 FE) MG PO TABS: 1.0000 | ORAL_TABLET | Freq: Every day | ORAL | 0 refills | Status: DC

## 2017-11-23 NOTE — Discharge Summary (Signed)
Physician Obstetric Discharge Summary  Patient ID: Monica Drake MRN: [id number] DOB/AGE: 1981/05/13 37 y.o.   Date of Admission: 11/21/2017  Date of Discharge: 11/23/2017  Admitting Diagnosis: Premature rupture of membranes at [redacted]w[redacted]d  Secondary Diagnosis: Anemia in pregnancy, anti E isoimmunization,   Mode of Delivery: normal spontaneous vaginal delivery 11/21/2017      Discharge Diagnosis: Term intrauterine pregnancy-delivered   Intrapartum Procedures: Repair of second degree perineal laceration, Pitocin augmentation   Post partum procedures: none  Complications: none   Brief Hospital Course  Monica Drake is a [id number] who had a SVD on 11/21/2017;  for further details of this delivery, please refer to the delivery note.  Patient had an uncomplicated postpartum course.  By time of discharge on PPD#2, her pain was controlled on oral pain medications; she had appropriate lochia and was ambulating, voiding without difficulty and tolerating regular diet.  She was deemed stable for discharge to home.    Labs: CBC Latest Ref Rng & Units 11/23/2017 11/22/2017 11/21/2017  WBC 3.6 - 11.0 K/uL 10.8 13.0(H) 9.9  Hemoglobin 12.0 - 16.0 g/dL 1.4(N) 8.2(N) 5.6(O)  Hematocrit 35.0 - 47.0 % 27.3(L) 26.7(L) 28.2(L)  Platelets 150 - 440 K/uL 273 282 334   O POS/ RI/ VI  Physical exam:  Blood pressure 100/74, pulse 72, temperature 97.8 F (36.6 C), temperature source Oral, resp. rate 20, height  (1.676 m), weight 69.4 kg (153 lb), last menstrual period 03/05/2017, SpO2 98 %, unknown if currently breastfeeding. General: alert and no distress Lochia: appropriate Abdomen: soft, NT Uterine Fundus: firm, U-4/ML/NT Perineum: healing well, no significant drainage, no dehiscence, no significant erythema Extremities: No evidence of DVT seen on physical exam.  Discharge Instructions: Per After Visit Summary. Activity: Advance as tolerated. Pelvic rest for 6 weeks.  Also refer to Discharge  Instructions Diet: Regular Medications: Allergies as of 11/23/2017   No Known Allergies     Medication List    TAKE these medications   acetaminophen 325 MG tablet Commonly known as:  TYLENOL Take 2 tablets (650 mg total) by mouth every 4 (four) hours as needed for mild pain or moderate pain (for pain scale < 4). What changed:    how much to take  when to take this  reasons to take this   ferrous fumarate 325 (106 Fe) MG Tabs tablet Commonly known as:  HEMOCYTE - 106 mg FE Take 1 tablet (106 mg of iron total) by mouth daily.   mesalamine 1.2 g EC tablet Commonly known as:  LIALDA Take 4 tablets (4.8 g total) by mouth daily with breakfast.   predniSONE 5 MG tablet Commonly known as:  DELTASONE Take 8 tablets (40 mg total) by mouth daily with breakfast for 7 days, THEN 7 tablets (35 mg total) daily with breakfast for 7 days, THEN 6 tablets (30 mg total) daily with breakfast for 7 days, THEN 5 tablets (25 mg total) daily with breakfast for 7 days, THEN 4 tablets (20 mg total) daily with breakfast for 7 days, THEN 3 tablets (15 mg total) daily with breakfast for 7 days, THEN 2 tablets (10 mg total) daily with breakfast for 7 days, THEN 1 tablet (5 mg total) daily with breakfast for 14 days. Start taking on:  11/11/2017   prenatal vitamin w/FE, FA 29-1 MG Chew chewable tablet Chew 1 tablet by mouth daily at 12 noon.      Outpatient follow up:  Postpartum contraception: condoms  Discharged Condition: stable  Discharged to:  home   Newborn Data: Monica Drake/ 6#1oz Disposition:home with mother  Apgars: APGAR (1 MIN): 8   APGAR (5 MINS): 9   APGAR (10 MINS):    Baby Feeding: Breast  Monica Drake, CNM 11/23/2017 12:21 PM

## 2017-11-23 NOTE — Progress Notes (Signed)
Pt discharged with infant.  Discharge instructions, prescriptions and follow up appointment given to and reviewed with pt. Pt verbalized understanding. Escorted out by auxillary. 

## 2017-11-25 ENCOUNTER — Telehealth: Payer: Self-pay | Admitting: *Deleted

## 2017-11-25 ENCOUNTER — Inpatient Hospital Stay: Payer: Medicaid Other

## 2017-11-25 NOTE — Telephone Encounter (Signed)
Please reschedule her IV iron (2 sessions this week) to next week if work out for her. Thanks.

## 2017-11-25 NOTE — Telephone Encounter (Signed)
I spoke with Inis Sizer in [place] and she will change appts and call patient\.

## 2017-11-25 NOTE — Telephone Encounter (Signed)
Patient called and states she had her baby Friday and has an appointment today at 11 for iron infusion. She is asking if she needs to keep that appointment or if she should wait a month to come in. Please advise

## 2017-11-26 ENCOUNTER — Encounter: Payer: Medicaid Other | Admitting: Maternal Newborn

## 2017-11-27 ENCOUNTER — Ambulatory Visit: Payer: Medicaid Other | Admitting: Gastroenterology

## 2017-11-27 ENCOUNTER — Encounter

## 2017-11-27 ENCOUNTER — Encounter: Payer: Medicaid Other | Admitting: Podiatry

## 2017-11-28 ENCOUNTER — Ambulatory Visit: Payer: Medicaid Other

## 2017-11-28 ENCOUNTER — Ambulatory Visit: Payer: Medicaid Other | Admitting: Gastroenterology

## 2017-12-02 ENCOUNTER — Encounter: Payer: Medicaid Other | Admitting: Obstetrics and Gynecology

## 2017-12-02 ENCOUNTER — Ambulatory Visit: Payer: Medicaid Other | Admitting: Podiatry

## 2017-12-02 ENCOUNTER — Encounter: Payer: Self-pay | Admitting: Podiatry

## 2017-12-02 ENCOUNTER — Ambulatory Visit (INDEPENDENT_AMBULATORY_CARE_PROVIDER_SITE_OTHER): Payer: Medicaid Other

## 2017-12-02 ENCOUNTER — Inpatient Hospital Stay: Payer: Medicaid Other | Attending: Oncology

## 2017-12-02 VITALS — BP 99/62 | HR 79 | Temp 97.2°F | Resp 20

## 2017-12-02 DIAGNOSIS — S92354D Nondisplaced fracture of fifth metatarsal bone, right foot, subsequent encounter for fracture with routine healing: Secondary | ICD-10-CM | POA: Diagnosis not present

## 2017-12-02 DIAGNOSIS — D508 Other iron deficiency anemias: Secondary | ICD-10-CM | POA: Insufficient documentation

## 2017-12-02 DIAGNOSIS — O99013 Anemia complicating pregnancy, third trimester: Secondary | ICD-10-CM

## 2017-12-02 MED ORDER — SODIUM CHLORIDE 0.9 % IV SOLN
Freq: Once | INTRAVENOUS | Status: AC
  Administered 2017-12-02: 10:00:00 via INTRAVENOUS
  Filled 2017-12-02: qty 1000

## 2017-12-02 MED ORDER — IRON SUCROSE 20 MG/ML IV SOLN
200.0000 mg | Freq: Once | INTRAVENOUS | Status: AC
  Administered 2017-12-02: 200 mg via INTRAVENOUS
  Filled 2017-12-02: qty 10

## 2017-12-02 MED ORDER — IRON SUCROSE 20 MG/ML IV SOLN
INTRAVENOUS | Status: AC
  Filled 2017-12-02: qty 10

## 2017-12-03 NOTE — Progress Notes (Signed)
She presents today for follow-up of the fifth metatarsal fracture right foot.  She continues to wear her cam walker.  She states that is approximately 50 to 60% improved.  Objective: Vital signs are stable alert and oriented x3.  Still has tenderness on palpation of the fifth metatarsal base and the peroneal tendons.  There is no overlying edema erythema cellulitis drainage or odor.  Radiographs demonstrate a small crack that was in the lateral cortex has resolved or nearly resolved at this point appears to be healing in very nicely.  Assessment: Healing fifth metatarsal fracture right.  Plan: Suggest that she continue to wear her cam walker for about another month if this is not resolved at that time and we may need to consider an MRI to rule out any soft tissue issues.

## 2017-12-05 ENCOUNTER — Inpatient Hospital Stay: Payer: Medicaid Other

## 2017-12-06 ENCOUNTER — Inpatient Hospital Stay: Payer: Medicaid Other

## 2017-12-06 VITALS — BP 106/66 | HR 82 | Resp 20

## 2017-12-06 DIAGNOSIS — D508 Other iron deficiency anemias: Secondary | ICD-10-CM

## 2017-12-06 DIAGNOSIS — O99013 Anemia complicating pregnancy, third trimester: Secondary | ICD-10-CM

## 2017-12-06 MED ORDER — IRON SUCROSE 20 MG/ML IV SOLN
200.0000 mg | Freq: Once | INTRAVENOUS | Status: AC
  Administered 2017-12-06: 200 mg via INTRAVENOUS
  Filled 2017-12-06: qty 10

## 2017-12-06 MED ORDER — SODIUM CHLORIDE 0.9 % IV SOLN
Freq: Once | INTRAVENOUS | Status: AC
  Administered 2017-12-06: 14:00:00 via INTRAVENOUS
  Filled 2017-12-06: qty 1000

## 2017-12-06 MED ORDER — SODIUM CHLORIDE 0.9% FLUSH
3.0000 mL | Freq: Once | INTRAVENOUS | Status: DC | PRN
  Filled 2017-12-06: qty 3

## 2017-12-06 MED ORDER — SODIUM CHLORIDE 0.9% FLUSH
10.0000 mL | INTRAVENOUS | Status: DC | PRN
  Filled 2017-12-06: qty 10

## 2017-12-10 ENCOUNTER — Other Ambulatory Visit: Payer: Self-pay

## 2017-12-10 ENCOUNTER — Ambulatory Visit (INDEPENDENT_AMBULATORY_CARE_PROVIDER_SITE_OTHER): Payer: Medicaid Other | Admitting: Gastroenterology

## 2017-12-10 ENCOUNTER — Encounter: Payer: Self-pay | Admitting: Gastroenterology

## 2017-12-10 VITALS — BP 105/68 | HR 69 | Wt 137.2 lb

## 2017-12-10 DIAGNOSIS — K5190 Ulcerative colitis, unspecified, without complications: Secondary | ICD-10-CM

## 2017-12-10 DIAGNOSIS — K625 Hemorrhage of anus and rectum: Secondary | ICD-10-CM

## 2017-12-10 MED ORDER — MESALAMINE 1.2 G PO TBEC: 4.8000 g | DELAYED_RELEASE_TABLET | Freq: Every day | ORAL | 0 refills | Status: DC

## 2017-12-10 MED ORDER — PREDNISONE 5 MG PO TABS: ORAL_TABLET | ORAL | 0 refills | Status: DC

## 2017-12-10 NOTE — Progress Notes (Signed)
[name] MD, MRCP(U.K) [address]  [address]  [place], [place] [address]  Main: [phone]  Fax: [phone]   Primary Care Physician: Nadara Mustard, MD  Primary Gastroenterologist:  Dr. [name]   Chief Complaint  Patient presents with  . Follow-up    ulcerative colitis    HPI: Monica Drake is a 37 y.o. female  Summary of history : She is here to follow up . She was seen 1 week back with a history of rectal bleeding since 05/2017. She is   [redacted]  weeks pregnant tomorrow.  A flexible sigmoidoscopy performed on 11/11/17 revealed moderate colitis and biopsies confirm ulcerative colitis with no HSV/CMV seen. I commenced her on steroids 10/2017  . MRI abdomen showed no abnormalities.  She had a vaginal deliver while on steroids and while in clinical remission. Baby and mother did well.   Interval history  11/18/2017-12/10/17  No bleeding , some cramping with every bowel movement , when she has to "go " has severe cramping , pain at the anus. And goes into her abdomen. Feels so bad that she has to avoid screaming . Stool is very soft . She has 1 bowel movement a day . On Lialda 4 tablets , no enemas. Steroids - 20 mg a day .     Current Outpatient Medications  Medication Sig Dispense Refill  . acetaminophen (TYLENOL) 325 MG tablet Take 2 tablets (650 mg total) by mouth every 4 (four) hours as needed for mild pain or moderate pain (for pain scale < 4). 60 tablet 1  . ferrous fumarate (HEMOCYTE - 106 MG FE) 325 (106 Fe) MG TABS tablet Take 1 tablet (106 mg of iron total) by mouth daily. 30 each 0  . mesalamine (LIALDA) 1.2 g EC tablet Take 4 tablets (4.8 g total) by mouth daily with breakfast. 120 tablet 0  . predniSONE (DELTASONE) 5 MG tablet Take 8 tablets (40 mg total) by mouth daily with breakfast for 7 days, THEN 7 tablets (35 mg total) daily with breakfast for 7 days, THEN 6 tablets (30 mg total) daily with breakfast for 7 days, THEN 5 tablets (25 mg total)  daily with breakfast for 7 days, THEN 4 tablets (20 mg total) daily with breakfast for 7 days, THEN 3 tablets (15 mg total) daily with breakfast for 7 days, THEN 2 tablets (10 mg total) daily with breakfast for 7 days, THEN 1 tablet (5 mg total) daily with breakfast for 14 days. 259 tablet 0  . prenatal vitamin w/FE, FA (NATACHEW) 29-1 MG CHEW chewable tablet Chew 1 tablet by mouth daily at 12 noon.     No current facility-administered medications for this visit.     Allergies as of 12/10/2017  . (No Known Allergies)    ROS:  General: Negative for anorexia, weight loss, fever, chills, fatigue, weakness. ENT: Negative for hoarseness, difficulty swallowing , nasal congestion. CV: Negative for chest pain, angina, palpitations, dyspnea on exertion, peripheral edema.  Respiratory: Negative for dyspnea at rest, dyspnea on exertion, cough, sputum, wheezing.  GI: See history of present illness. GU:  Negative for dysuria, hematuria, urinary incontinence, urinary frequency, nocturnal urination.  Endo: Negative for unusual weight change.    Physical Examination:   BP 105/68   Pulse 69   Wt 137 lb 3.2 oz (62.2 kg)   BMI 22.14 kg/m   General: Well-nourished, well-developed in no acute distress.  Eyes: No icterus. Conjunctivae pink. Mouth: Oropharyngeal mucosa moist and pink ,  no lesions erythema or exudate. Lungs: Clear to auscultation bilaterally. Non-labored. Heart: Regular rate and rhythm, no murmurs rubs or gallops.  Abdomen: Bowel sounds are normal, nontender, nondistended, no hepatosplenomegaly or masses, no abdominal bruits or hernia , no rebound or guarding.   Extremities: No lower extremity edema. No clubbing or deformities. Neuro: Alert and oriented x 3.  Grossly intact. Skin: Warm and dry, no jaundice.   Psych: Alert and cooperative, normal mood and affect.   Imaging Studies: Dg Foot Complete Right  Result Date: 12/02/2017 Please see detailed radiograph report in office  note.   Assessment and Plan:   Monica Drake is a 37 y.o. y/o female here to follow up after  a new diagnosis of ulcerative colitis which was made during her pregnancy . Pancolitis. S/p vaginal delivery in 10/2017 . Commenced on steroids, Rowasa and Lialda.   Plan  1. CBC,CRP  2 Continue steroids - decrease by 5 mg next 5 days till she is on 5 mg and see me on June 3rd at sigmoidoscopy   3. Continue Lialda  4. Sigmoidoscopy June 3rd  5. While she is dropping dose steroids - if symptoms worse, revert to dose of prednisone at which she had no symptoms and call office to be seen .    I have discussed alternative options, risks & benefits,  which include, but are not limited to, bleeding, infection, perforation,respiratory complication & drug reaction.  The patient agrees with this plan & written consent will be obtained.      Dr [name]  MD,MRCP [facility]) Follow up in 4 weeks

## 2017-12-11 LAB — C-REACTIVE PROTEIN: CRP: 2.6 mg/L (ref 0.0–4.9)

## 2017-12-11 LAB — CBC
HEMOGLOBIN: 13.3 g/dL (ref 11.1–15.9)
Hematocrit: 42.7 % (ref 34.0–46.6)
MCH: 29.5 pg (ref 26.6–33.0)
MCHC: 31.1 g/dL — AB (ref 31.5–35.7)
MCV: 95 fL (ref 79–97)
Platelets: 376 10*3/uL (ref 150–379)
RBC: 4.51 x10E6/uL (ref 3.77–5.28)
RDW: 14.5 % (ref 12.3–15.4)
WBC: 7.2 10*3/uL (ref 3.4–10.8)

## 2017-12-11 NOTE — Progress Notes (Signed)
This encounter was created in error - please disregard.

## 2017-12-12 ENCOUNTER — Inpatient Hospital Stay: Payer: Medicaid Other

## 2017-12-12 VITALS — BP 114/74 | HR 63 | Temp 97.4°F | Resp 17

## 2017-12-12 DIAGNOSIS — D508 Other iron deficiency anemias: Secondary | ICD-10-CM | POA: Diagnosis not present

## 2017-12-12 DIAGNOSIS — O99013 Anemia complicating pregnancy, third trimester: Secondary | ICD-10-CM

## 2017-12-12 MED ORDER — SODIUM CHLORIDE 0.9 % IV SOLN
Freq: Once | INTRAVENOUS | Status: AC
  Administered 2017-12-12: 10:00:00 via INTRAVENOUS
  Filled 2017-12-12: qty 1000

## 2017-12-12 MED ORDER — IRON SUCROSE 20 MG/ML IV SOLN
200.0000 mg | Freq: Once | INTRAVENOUS | Status: AC
  Administered 2017-12-12: 200 mg via INTRAVENOUS
  Filled 2017-12-12: qty 10

## 2017-12-12 NOTE — Patient Instructions (Signed)
Iron Sucrose injection What is this medicine? IRON SUCROSE (AHY ern SOO krohs) is an iron complex. Iron is used to make healthy red blood cells, which carry oxygen and nutrients throughout the body. This medicine is used to treat iron deficiency anemia in people with chronic kidney disease. This medicine may be used for other purposes; ask your health care provider or pharmacist if you have questions. COMMON BRAND NAME(S): Venofer What should I tell my health care provider before I take this medicine? They need to know if you have any of these conditions: -anemia not caused by low iron levels -heart disease -high levels of iron in the blood -kidney disease -liver disease -an unusual or allergic reaction to iron, other medicines, foods, dyes, or preservatives -pregnant or trying to get pregnant -breast-feeding How should I use this medicine? This medicine is for infusion into a vein. It is given by a health care professional in a hospital or clinic setting. Talk to your pediatrician regarding the use of this medicine in children. While this drug may be prescribed for children as young as 2 years for selected conditions, precautions do apply. Overdosage: If you think you have taken too much of this medicine contact a poison control center or emergency room at once. NOTE: This medicine is only for you. Do not share this medicine with others. What if I miss a dose? It is important not to miss your dose. Call your doctor or health care professional if you are unable to keep an appointment. What may interact with this medicine? Do not take this medicine with any of the following medications: -deferoxamine -dimercaprol -other iron products This medicine may also interact with the following medications: -chloramphenicol -deferasirox This list may not describe all possible interactions. Give your health care provider a list of all the medicines, herbs, non-prescription drugs, or dietary  supplements you use. Also tell them if you smoke, drink alcohol, or use illegal drugs. Some items may interact with your medicine. What should I watch for while using this medicine? Visit your doctor or healthcare professional regularly. Tell your doctor or healthcare professional if your symptoms do not start to get better or if they get worse. You may need blood work done while you are taking this medicine. You may need to follow a special diet. Talk to your doctor. Foods that contain iron include: whole grains/cereals, dried fruits, beans, or peas, leafy green vegetables, and organ meats (liver, kidney). What side effects may I notice from receiving this medicine? Side effects that you should report to your doctor or health care professional as soon as possible: -allergic reactions like skin rash, itching or hives, swelling of the face, lips, or tongue -breathing problems -changes in blood pressure -cough -fast, irregular heartbeat -feeling faint or lightheaded, falls -fever or chills -flushing, sweating, or hot feelings -joint or muscle aches/pains -seizures -swelling of the ankles or feet -unusually weak or tired Side effects that usually do not require medical attention (report to your doctor or health care professional if they continue or are bothersome): -diarrhea -feeling achy -headache -irritation at site where injected -nausea, vomiting -stomach upset -tiredness This list may not describe all possible side effects. Call your doctor for medical advice about side effects. You may report side effects to FDA at 1-800-FDA-1088. Where should I keep my medicine? This drug is given in a hospital or clinic and will not be stored at home. NOTE: This sheet is a summary. It may not cover all possible information. If   you have questions about this medicine, talk to your doctor, pharmacist, or health care provider.  2018 Elsevier/Gold Standard (2011-04-26 17:14:35)  

## 2017-12-25 ENCOUNTER — Ambulatory Visit: Payer: Medicaid Other | Admitting: Podiatry

## 2017-12-29 ENCOUNTER — Encounter: Payer: Self-pay | Admitting: Gastroenterology

## 2017-12-30 ENCOUNTER — Encounter: Payer: Self-pay | Admitting: Certified Registered Nurse Anesthetist

## 2017-12-30 ENCOUNTER — Ambulatory Visit
Admission: RE | Admit: 2017-12-30 | Discharge: 2017-12-30 | Disposition: A | Discharge status: Home / Self Care | Payer: Medicaid Other | Source: Ambulatory Visit | Attending: Gastroenterology | Admitting: Gastroenterology

## 2017-12-30 ENCOUNTER — Encounter
Admission: RE | Disposition: A | Discharge status: Home / Self Care | Payer: Self-pay | Source: Ambulatory Visit | Attending: Gastroenterology

## 2017-12-30 ENCOUNTER — Other Ambulatory Visit: Payer: Self-pay | Admitting: *Deleted

## 2017-12-30 DIAGNOSIS — D508 Other iron deficiency anemias: Secondary | ICD-10-CM

## 2017-12-30 DIAGNOSIS — Z79899 Other long term (current) drug therapy: Secondary | ICD-10-CM | POA: Insufficient documentation

## 2017-12-30 DIAGNOSIS — Z8249 Family history of ischemic heart disease and other diseases of the circulatory system: Secondary | ICD-10-CM | POA: Diagnosis not present

## 2017-12-30 DIAGNOSIS — K5190 Ulcerative colitis, unspecified, without complications: Secondary | ICD-10-CM | POA: Diagnosis not present

## 2017-12-30 DIAGNOSIS — K625 Hemorrhage of anus and rectum: Secondary | ICD-10-CM

## 2017-12-30 HISTORY — PX: FLEXIBLE SIGMOIDOSCOPY: SHX5431

## 2017-12-30 LAB — GLUCOSE, CAPILLARY: Glucose-Capillary: 63 mg/dL — ABNORMAL LOW (ref 65–99)

## 2017-12-30 LAB — POCT PREGNANCY, URINE: Preg Test, Ur: NEGATIVE

## 2017-12-30 SURGERY — SIGMOIDOSCOPY, FLEXIBLE

## 2017-12-30 MED ORDER — LIDOCAINE HCL URETHRAL/MUCOSAL 2 % EX GEL
CUTANEOUS | Status: AC
  Filled 2017-12-30: qty 5

## 2017-12-30 MED ORDER — LIDOCAINE HCL URETHRAL/MUCOSAL 2 % EX GEL
CUTANEOUS | Status: AC
  Administered 2017-12-30: 5
  Filled 2017-12-30: qty 5

## 2017-12-30 MED ORDER — SODIUM CHLORIDE 0.9 % IV SOLN
INTRAVENOUS | Status: DC
  Administered 2017-12-30: 1000 mL via INTRAVENOUS

## 2017-12-30 MED ORDER — PREDNISONE 2.5 MG PO TABS: ORAL_TABLET | ORAL | 0 refills | Status: DC

## 2017-12-30 NOTE — Op Note (Signed)
[facility] Gastroenterology Patient Name: Monica HildingKimberly Drake Procedure Date: 12/30/2017 8:29 AM MRN: [id number] Account #: [id number] Date of Birth: 1981/06/12 Admit Type: Outpatient Age: 37 Room: [facility] ENDO ROOM 4 Gender: Female Note Status: Finalized Procedure:            Flexible Sigmoidoscopy Indications:          Follow-up of ulcerative colitis Providers:            Wyline MoodKiran Teressa Mcglocklin MD, MD Referring MD:         Nadara Mustardobert P. Harris, MD (Referring MD) Medicines:            None Complications:        No immediate complications. Procedure:            Pre-Anesthesia Assessment:                       - ASA Grade Assessment: II - A patient with mild                        systemic disease.                       - Prior to the procedure, a History and Physical was                        performed, and patient medications, allergies and                        sensitivities were reviewed. The patient's tolerance of                        previous anesthesia was reviewed.                       - The risks and benefits of the procedure and the                        sedation options and risks were discussed with the                        patient. All questions were answered and informed                        consent was obtained.                       After obtaining informed consent, the scope was passed                        under direct vision. The Endoscope was introduced                        through the anus and advanced to the the descending                        colon. The flexible sigmoidoscopy was accomplished with                        ease. The patient tolerated the procedure well. The  quality of the bowel preparation was good. Findings:      The perianal and digital rectal examinations were normal.      The entire examined colon appeared normal. Impression:           - The entire examined colon is normal.                       - No  specimens collected. Recommendation:       - Discharge patient to home (with escort). Procedure Code(s):    --- Professional ---                       [phone], Sigmoidoscopy, flexible; diagnostic, including                        collection of specimen(s) by brushing or washing, when                        performed (separate procedure) Diagnosis Code(s):    --- Professional ---                       K51.90, Ulcerative colitis, unspecified, without                        complications CPT copyright 2017 American Medical Association. All rights reserved. The codes documented in this report are preliminary and upon coder review may  be revised to meet current compliance requirements. [name], MD [name] MD, MD 12/30/2017 8:50:56 AM This report has been signed electronically. Number of Addenda: 0 Note Initiated On: 12/30/2017 8:29 AM Total Procedure Duration: 0 hours 3 minutes 12 seconds       [facility]

## 2017-12-30 NOTE — H&P (Signed)
Monica Mood, MD [address], [place], [place], [address] [address], [address], [place], [place], [address] Phone: [phone]  Fax: [phone]  Primary Care Physician:  Nadara Mustard, MD   Pre-Procedure History & Physical: HPI:  Monica Drake is a 37 y.o. female is here for a sigmoidoscopy    Past Medical History:  Diagnosis Date  . Foot fracture, right   . Hypoglycemia   . Iron deficiency anemia 11/21/2017  . Pregnant and not yet delivered in third trimester   . Ulcerative colitis (HCC) 11/11/2017    Past Surgical History:  Procedure Laterality Date  . ECTOPIC PREGNANCY SURGERY  02/27/2005  . FLEXIBLE SIGMOIDOSCOPY N/A 11/11/2017   Procedure: FLEXIBLE SIGMOIDOSCOPY;  Surgeon: Monica Mood, MD;  Location: [facility] ENDOSCOPY;  Service: Gastroenterology;  Laterality: N/A;  Please use tap water enema prior to procedure. Note: Patient is hypoglycemic  . HERNIA REPAIR      Prior to Admission medications   Medication Sig Start Date End Date Taking? Authorizing Provider  acetaminophen (TYLENOL) 325 MG tablet Take 2 tablets (650 mg total) by mouth every 4 (four) hours as needed for mild pain or moderate pain (for pain scale < 4). 11/23/17  Yes Farrel Conners, CNM  ferrous fumarate (HEMOCYTE - 106 MG FE) 325 (106 Fe) MG TABS tablet Take 1 tablet (106 mg of iron total) by mouth daily. 11/23/17  Yes Farrel Conners, CNM  mesalamine (LIALDA) 1.2 g EC tablet Take 4 tablets (4.8 g total) by mouth daily with breakfast. 12/10/17 01/09/18 Yes Monica Mood, MD  predniSONE (DELTASONE) 5 MG tablet Take 8 tablets (40 mg total) by mouth daily with breakfast for 7 days, THEN 7 tablets (35 mg total) daily with breakfast for 7 days, THEN 6 tablets (30 mg total) daily with breakfast for 7 days, THEN 5 tablets (25 mg total) daily with breakfast for 7 days, THEN 4 tablets (20 mg total) daily with breakfast for 7 days, THEN 3 tablets (15 mg total) daily with breakfast for 7 days, THEN  2 tablets (10 mg total) daily with breakfast for 7 days, THEN 1 tablet (5 mg total) daily with breakfast for 14 days. 12/10/17 02/11/18 Yes Monica Mood, MD  prenatal vitamin w/FE, FA (NATACHEW) 29-1 MG CHEW chewable tablet Chew 1 tablet by mouth daily at 12 noon.   Yes [provider]    Allergies as of 12/10/2017  . (No Known Allergies)    Family History  Problem Relation Age of Onset  . Diabetes Mother   . Hypertension Mother   . Arthritis Mother   . Heart disease Mother   . Hypertension Father   . Arthritis Father   . Birth defects Sister     Social History   Socioeconomic History  . Marital status: Married    Spouse name: Not on file  . Number of children: Not on file  . Years of education: Not on file  . Highest education level: Not on file  Occupational History  . Not on file  Social Needs  . Financial resource strain: Not on file  . Food insecurity:    Worry: Not on file    Inability: Not on file  . Transportation needs:    Medical: Not on file    Non-medical: Not on file  Tobacco Use  . Smoking status: Never Smoker  . Smokeless tobacco: Never Used  Substance and Sexual Activity  . Alcohol use: No  . Drug use: No  .  Sexual activity: Yes    Birth control/protection: Condom  Lifestyle  . Physical activity:    Days per week: Not on file    Minutes per session: Not on file  . Stress: Not on file  Relationships  . Social connections:    Talks on phone: Not on file    Gets together: Not on file    Attends religious service: Not on file    Active member of club or organization: Not on file    Attends meetings of clubs or organizations: Not on file    Relationship status: Not on file  . Intimate partner violence:    Fear of current or ex partner: Not on file    Emotionally abused: Not on file    Physically abused: Not on file    Forced sexual activity: Not on file  Other Topics Concern  . Not on file  Social History Narrative  . Not on file     Review of Systems: See HPI, otherwise negative ROS  Physical Exam: BP 102/70   Pulse (!) 59   Temp (!) 96.3 F (35.7 C) (Tympanic)   Resp 16   Ht 5\' 6"  (1.676 m)   Wt 128 lb (58.1 kg)   SpO2 100%   BMI 20.66 kg/m  General:   Alert,  pleasant and cooperative in NAD Head:  Normocephalic and atraumatic. Neck:  Supple; no masses or thyromegaly. Lungs:  Clear throughout to auscultation, normal respiratory effort.    Heart:  +S1, +S2, Regular rate and rhythm, No edema. Abdomen:  Soft, nontender and nondistended. Normal bowel sounds, without guarding, and without rebound.   Neurologic:  Alert and  oriented x4;  grossly normal neurologically.  Impression/Plan: Monica Drake is here for a sigmoidoscopy  to be performed for evaluation of colitis.  Risks, benefits, limitations, and alternatives regarding  colonoscopy have been reviewed with the patient.  Questions have been answered.  All parties agreeable.   Monica MoodKiran Kayman Snuffer, MD  12/30/2017, 8:27 AM

## 2017-12-30 NOTE — OR Nursing (Signed)
0830....  Fingerstick 63.  Dr. Tobi BastosAnna aware.  BS to be checked in post-op

## 2018-01-01 ENCOUNTER — Inpatient Hospital Stay: Payer: Medicaid Other | Attending: Oncology

## 2018-01-01 DIAGNOSIS — D508 Other iron deficiency anemias: Secondary | ICD-10-CM | POA: Insufficient documentation

## 2018-01-01 LAB — IRON AND TIBC
IRON: 55 ug/dL (ref 28–170)
Saturation Ratios: 18 % (ref 10.4–31.8)
TIBC: 310 ug/dL (ref 250–450)
UIBC: 255 ug/dL

## 2018-01-01 LAB — CBC WITH DIFFERENTIAL/PLATELET
BASOS ABS: 0 10*3/uL (ref 0–0.1)
Basophils Relative: 1 %
EOS PCT: 3 %
Eosinophils Absolute: 0.1 10*3/uL (ref 0–0.7)
HEMATOCRIT: 40.7 % (ref 35.0–47.0)
Hemoglobin: 13.3 g/dL (ref 12.0–16.0)
LYMPHS ABS: 1.5 10*3/uL (ref 1.0–3.6)
LYMPHS PCT: 33 %
MCH: 29.7 pg (ref 26.0–34.0)
MCHC: 32.7 g/dL (ref 32.0–36.0)
MCV: 90.7 fL (ref 80.0–100.0)
MONO ABS: 0.4 10*3/uL (ref 0.2–0.9)
MONOS PCT: 10 %
NEUTROS ABS: 2.4 10*3/uL (ref 1.4–6.5)
Neutrophils Relative %: 53 %
PLATELETS: 206 10*3/uL (ref 150–440)
RBC: 4.49 MIL/uL (ref 3.80–5.20)
RDW: 14.1 % (ref 11.5–14.5)
WBC: 4.5 10*3/uL (ref 3.6–11.0)

## 2018-01-01 LAB — FERRITIN: FERRITIN: 58 ng/mL (ref 11–307)

## 2018-01-02 ENCOUNTER — Inpatient Hospital Stay: Payer: Medicaid Other

## 2018-01-02 ENCOUNTER — Encounter: Payer: Self-pay | Admitting: Gastroenterology

## 2018-01-02 ENCOUNTER — Other Ambulatory Visit: Payer: Self-pay

## 2018-01-02 ENCOUNTER — Inpatient Hospital Stay (HOSPITAL_BASED_OUTPATIENT_CLINIC_OR_DEPARTMENT_OTHER): Payer: Medicaid Other | Admitting: Oncology

## 2018-01-02 VITALS — BP 108/73 | HR 72 | Temp 96.7°F | Wt 134.4 lb

## 2018-01-02 DIAGNOSIS — D508 Other iron deficiency anemias: Secondary | ICD-10-CM | POA: Diagnosis not present

## 2018-01-02 MED ORDER — FERROUS SULFATE 325 (65 FE) MG PO TBEC: 325.0000 mg | DELAYED_RELEASE_TABLET | Freq: Two times a day (BID) | ORAL | 3 refills | Status: AC

## 2018-01-02 NOTE — Progress Notes (Signed)
Patient here today for follow up.   

## 2018-01-02 NOTE — Progress Notes (Signed)
Hematology/Oncology  Follow up note [facility] Telephone:([phone]) [phone] [phone]   Patient Care Team: Nadara Mustard, MD as PCP - General (Obstetrics and Gynecology)  REFERRING PROVIDER: Dr.Schuman  REASON FOR VISIT Follow up for treatment of anemia.   HISTORY OF PRESENTING ILLNESS:  Monica Drake is a  37 y.o.  female with PMH listed below who was referred to me for evaluation of anemia. Patient is [redacted] weeks pregnant and this is her fifth child.  Reports pregnancy being non-complicated.  Plan induction on Dec 02, 2017. History of iron deficiency: Reports history of iron deficiency in the past. Rectal bleeding: Denies Menstrual bleeding/ Vaginal bleeding : Denies Hematemesis or hemoptysis : Denies Blood in urine : Denies Pica: Denies Last endoscopy: Never had  INTERVAL HISTORY Monica Drake is a 37 y.o. female who has above history reviewed by me today presents for follow up visit for management of anemia in preganancy Problems and complaints are listed below: # Iron deficiency anemia: s/p IV iron. Hemoglobin gets better.  # Fatigue: improved.  # Pregnancy: delivered baby boy. Currently breast feeding.   Review of Systems  Constitutional: Negative for chills, fever, malaise/fatigue and weight loss.  HENT: Negative for congestion, ear discharge, ear pain, nosebleeds, sinus pain and sore throat.   Eyes: Negative for double vision, photophobia, pain, discharge and redness.  Respiratory: Negative for cough, hemoptysis, sputum production, shortness of breath and wheezing.   Cardiovascular: Negative for chest pain, palpitations, orthopnea, claudication and leg swelling.  Gastrointestinal: Negative for abdominal pain, blood in stool, constipation, diarrhea, heartburn, melena, nausea and vomiting.  Genitourinary: Negative for dysuria, flank pain, frequency and hematuria.  Musculoskeletal: Negative for back pain, myalgias and neck pain.  Skin:  Negative for itching and rash.  Neurological: Negative for dizziness, tingling, tremors, focal weakness, weakness and headaches.  Endo/Heme/Allergies: Negative for environmental allergies. Does not bruise/bleed easily.  Psychiatric/Behavioral: Negative for depression, hallucinations, substance abuse and suicidal ideas. The patient is not nervous/anxious.     MEDICAL HISTORY:  Past Medical History:  Diagnosis Date  . Foot fracture, right   . Hypoglycemia   . Iron deficiency anemia 11/21/2017  . Pregnant and not yet delivered in third trimester   . Ulcerative colitis (HCC) 11/11/2017    SURGICAL HISTORY: Past Surgical History:  Procedure Laterality Date  . ECTOPIC PREGNANCY SURGERY  02/27/2005  . FLEXIBLE SIGMOIDOSCOPY N/A 11/11/2017   Procedure: FLEXIBLE SIGMOIDOSCOPY;  Surgeon: [name], MD;  Location: [facility] ENDOSCOPY;  Service: Gastroenterology;  Laterality: N/A;  Please use tap water enema prior to procedure. Note: Patient is hypoglycemic  . FLEXIBLE SIGMOIDOSCOPY N/A 12/30/2017   Procedure: FLEXIBLE SIGMOIDOSCOPY;  Surgeon: [name], MD;  Location[facility] ENDOSCOPY;  Service: Gastroenterology;  Laterality: N/A;  . HERNIA REPAIR      SOCIAL HISTORY: Social History   Socioeconomic History  . Marital status: Married    Spouse name: Not on file  . Number of children: Not on file  . Years of education: Not on file  . Highest education level: Not on file  Occupational History  . Not on file  Social Needs  . Financial resource strain: Not on file  . Food insecurity:    Worry: Not on file    Inability: Not on file  . Transportation needs:    Medical: Not on file    Non-medical: Not on file  Tobacco Use  . Smoking status: Never Smoker  . Smokeless tobacco: Never Used  Substance and Sexual  Activity  . Alcohol use: No  . Drug use: No  . Sexual activity: Yes    Birth control/protection: Condom  Lifestyle  . Physical activity:    Days per week: Not on file    Minutes  per session: Not on file  . Stress: Not on file  Relationships  . Social connections:    Talks on phone: Not on file    Gets together: Not on file    Attends religious service: Not on file    Active member of club or organization: Not on file    Attends meetings of clubs or organizations: Not on file    Relationship status: Not on file  . Intimate partner violence:    Fear of current or ex partner: Not on file    Emotionally abused: Not on file    Physically abused: Not on file    Forced sexual activity: Not on file  Other Topics Concern  . Not on file  Social History Narrative  . Not on file    FAMILY HISTORY: Family History  Problem Relation Age of Onset  . Diabetes Mother   . Hypertension Mother   . Arthritis Mother   . Heart disease Mother   . Hypertension Father   . Arthritis Father   . Birth defects Sister     ALLERGIES:  has No Known Allergies.  MEDICATIONS:  Current Outpatient Medications  Medication Sig Dispense Refill  . acetaminophen (TYLENOL) 325 MG tablet Take 2 tablets (650 mg total) by mouth every 4 (four) hours as needed for mild pain or moderate pain (for pain scale < 4). 60 tablet 1  . ferrous fumarate (HEMOCYTE - 106 MG FE) 325 (106 Fe) MG TABS tablet Take 1 tablet (106 mg of iron total) by mouth daily. 30 each 0  . mesalamine (LIALDA) 1.2 g EC tablet Take 4 tablets (4.8 g total) by mouth daily with breakfast. 120 tablet 0  . predniSONE (DELTASONE) 2.5 MG tablet Take 2 tablets daily for 4 days then 1 tablet daily for 4 days and then 1 tablet every other day for 6 days then stop 14 tablet 0  . prenatal vitamin w/FE, FA (NATACHEW) 29-1 MG CHEW chewable tablet Chew 1 tablet by mouth daily at 12 noon.     No current facility-administered medications for this visit.      PHYSICAL EXAMINATION: ECOG PERFORMANCE STATUS: 1 - Symptomatic but completely ambulatory There were no vitals filed for this visit. There were no vitals filed for this  visit.  Physical Exam  Constitutional: She is oriented to person, place, and time. She appears well-developed and well-nourished. No distress.  HENT:  Head: Normocephalic and atraumatic.  Right Ear: External ear normal.  Left Ear: External ear normal.  Mouth/Throat: Oropharynx is clear and moist.  Eyes: Pupils are equal, round, and reactive to light. Conjunctivae and EOM are normal. No scleral icterus.  Neck: Normal range of motion. Neck supple.  Cardiovascular: Normal rate, regular rhythm and normal heart sounds.  Pulmonary/Chest: Effort normal and breath sounds normal. No respiratory distress. She has no wheezes. She has no rales. She exhibits no tenderness.  Abdominal: Soft. Bowel sounds are normal. She exhibits no distension and no mass. There is no tenderness.  Musculoskeletal: Normal range of motion. She exhibits no edema or deformity.  Lymphadenopathy:    She has no cervical adenopathy.  Neurological: She is alert and oriented to person, place, and time. No cranial nerve deficit. Coordination normal.  Skin: Skin  is warm and dry. No rash noted.  Psychiatric: She has a normal mood and affect. Her behavior is normal. Thought content normal.     LABORATORY DATA:  I have reviewed the data as listed Lab Results  Component Value Date   WBC 4.5 01/01/2018   HGB 13.3 01/01/2018   HCT 40.7 01/01/2018   MCV 90.7 01/01/2018   PLT 206 01/01/2018   Recent Labs    11/05/17 1006  NA 138  K 4.1  CL 104  CO2 21  GLUCOSE 72  BUN 7  CREATININE 0.51*  CALCIUM 8.7  GFRNONAA 126  GFRAA 145  PROT 5.8*  ALBUMIN 3.3*  AST 19  ALT 12  ALKPHOS 107  BILITOT <0.2   Iron/TIBC/Ferritin/ %Sat    Component Value Date/Time   IRON 55 01/01/2018 0905   TIBC 310 01/01/2018 0905   FERRITIN 58 01/01/2018 0905   IRONPCTSAT 18 01/01/2018 0905     ASSESSMENT & PLAN:  1. Other iron deficiency anemia   2. Lactating mother    # Iron deficiency anemia in lactating mother  Labs reviewed.  Hemoglobin improved to 13.5. Iron panel consistent with improved iron store.  Hold additional IV iron.  Suggest oral iron supplementation given that she is lactating which will be a source of iron loss.  Ferrous sulfate 325mg  BID sent to Pharmacy.   Repeat iron TIBC CBC prior to next visit in 3 months.   All questions were answered. The patient knows to call the clinic with any problems questions or concerns.  Return of visit: 3 months to reassess iron deficiency anemia.   Rickard Patience, MD, PhD Hematology Oncology [facility] at [facility] Pager- [phone] 01/02/2018

## 2018-01-03 ENCOUNTER — Ambulatory Visit (INDEPENDENT_AMBULATORY_CARE_PROVIDER_SITE_OTHER): Payer: Medicaid Other | Admitting: Obstetrics and Gynecology

## 2018-01-03 ENCOUNTER — Encounter: Payer: Self-pay | Admitting: Obstetrics and Gynecology

## 2018-01-03 NOTE — Progress Notes (Signed)
  OBSTETRICS POSTPARTUM CLINIC PROGRESS NOTE  Subjective:     Monica Drake is a 37 y.o. [phone] female who presents for a postpartum visit. She is 6 weeks postpartum following a Term pregnancy and delivery by Vaginal, no problems at delivery.  I have fully reviewed the prenatal and intrapartum course. Anesthesia: none.  Postpartum course has been complicated by uncomplicated.  Baby is feeding by Breast.  Bleeding: patient has not  resumed menses.  Bowel function is normal. Bladder function is normal.  Patient is not sexually active. Contraception method desired is none.  Postpartum depression screening: negative. Edinburgh 6.  The following portions of the patient's history were reviewed and updated as appropriate: allergies, current medications, past family history, past medical history, past social history, past surgical history and problem list.  Review of Systems A comprehensive review of systems was negative.  Objective:    BP 102/62   Pulse 71   Ht 5\' 6"  (1.676 m)   Wt 132 lb (59.9 kg)   Breastfeeding? Yes   BMI 21.31 kg/m   General:  alert and no distress   Breasts:  inspection negative, no nipple discharge or bleeding, no masses or nodularity palpable  Lungs: clear to auscultation bilaterally  Heart:  regular rate and rhythm, S1, S2 normal, no murmur, click, rub or gallop  Abdomen: soft, non-tender; bowel sounds normal; no masses,  no organomegaly.     Vulva:  normal  Vagina: normal vagina, no discharge, exudate, lesion, or erythema  Cervix:  no cervical motion tenderness and no lesions  Corpus: normal size, contour, position, consistency, mobility, non-tender  Adnexa:  normal adnexa and no mass, fullness, tenderness  Rectal Exam: Not performed.          Assessment:  Post Partum Care visit There are no diagnoses linked to this encounter.  Plan:  See orders and Patient Instructions Follow up in: 1 year or as needed.   Adelene Idlerhristanna Jeralynn Vaquera MD [facility], [facility] 01/03/18 10:16 AM

## 2018-01-07 ENCOUNTER — Ambulatory Visit: Payer: Medicaid Other | Admitting: Gastroenterology

## 2018-01-07 ENCOUNTER — Encounter: Payer: Self-pay | Admitting: Gastroenterology

## 2018-01-07 VITALS — BP 102/66 | HR 65 | Ht 66.0 in | Wt 133.6 lb

## 2018-01-07 DIAGNOSIS — K5190 Ulcerative colitis, unspecified, without complications: Secondary | ICD-10-CM

## 2018-01-07 NOTE — Progress Notes (Signed)
Wyline MoodKiran Burlin Mcnair MD, MRCP(U.K) [address]  [address]  [place], [place] [address]  Main: [phone]  Fax: [phone]   Primary Care Physician: Nadara MustardHarris, Robert P, MD  Primary Gastroenterologist:  Dr. Wyline MoodKiran Teyana Pierron   Chief Complaint  Patient presents with  . Follow-up    HPI: Monica Drake is a 37 y.o. female    Summary of history : She is here to follow up . She was seen 1 week back with a history of rectal bleeding since 05/2017. She is [redacted] weeks pregnant tomorrow. A flexible sigmoidoscopy performed on 11/11/17 revealed moderate colitis and biopsies confirm ulcerative colitis with no HSV/CMV seen. I commenced her on steroids 10/2017  . MRI abdomen showed no abnormalities.  She had a vaginal deliver while on steroids and while in clinical remission. Baby and mother did well.   Interval history5/14/19-6/11/19 12/30/17- sigmoidoscopy- normal appearing mucosa  Labs 01/01/18 - normal iron studies. Hb 13 grams   CRP 12/10/17- 2.6  No bleeding , has 1 bowel movement a day , soft . No blood. On 2.5 mg prednisone every other day since today .    Current Outpatient Medications  Medication Sig Dispense Refill  . acetaminophen (TYLENOL) 325 MG tablet Take 2 tablets (650 mg total) by mouth every 4 (four) hours as needed for mild pain or moderate pain (for pain scale < 4). 60 tablet 1  . ferrous sulfate 325 (65 FE) MG EC tablet Take 1 tablet (325 mg total) by mouth 2 (two) times daily with a meal. 60 tablet 3  . mesalamine (LIALDA) 1.2 g EC tablet Take 4 tablets (4.8 g total) by mouth daily with breakfast. 120 tablet 0  . predniSONE (DELTASONE) 2.5 MG tablet Take 2 tablets daily for 4 days then 1 tablet daily for 4 days and then 1 tablet every other day for 6 days then stop 14 tablet 0  . prenatal vitamin w/FE, FA (NATACHEW) 29-1 MG CHEW chewable tablet Chew 1 tablet by mouth daily at 12 noon.     No current facility-administered medications for this visit.     Allergies as of  01/07/2018  . (No Known Allergies)    ROS:  General: Negative for anorexia, weight loss, fever, chills, fatigue, weakness. ENT: Negative for hoarseness, difficulty swallowing , nasal congestion. CV: Negative for chest pain, angina, palpitations, dyspnea on exertion, peripheral edema.  Respiratory: Negative for dyspnea at rest, dyspnea on exertion, cough, sputum, wheezing.  GI: See history of present illness. GU:  Negative for dysuria, hematuria, urinary incontinence, urinary frequency, nocturnal urination.  Endo: Negative for unusual weight change.    Physical Examination:   BP 102/66 (BP Location: Right Arm, Patient Position: Sitting, Cuff Size: Normal)   Pulse 65   Ht 5\' 6"  (1.676 m)   Wt 133 lb 9.6 oz (60.6 kg)   BMI 21.56 kg/m   General: Well-nourished, well-developed in no acute distress.  Eyes: No icterus. Conjunctivae pink. Mouth: Oropharyngeal mucosa moist and pink , no lesions erythema or exudate. Lungs: Clear to auscultation bilaterally. Non-labored. Heart: Regular rate and rhythm, no murmurs rubs or gallops.  Abdomen: Bowel sounds are normal, nontender, nondistended, no hepatosplenomegaly or masses, no abdominal bruits or hernia , no rebound or guarding.   Extremities: No lower extremity edema. No clubbing or deformities. Neuro: Alert and oriented x 3.  Grossly intact. Skin: Warm and dry, no jaundice.   Psych: Alert and cooperative, normal mood and affect.   Imaging Studies: No results found.  Assessment and Plan:   Monica Drake is a 37 y.o. y/o female here to follow up after  a new diagnosis of ulcerative colitis which was made during her pregnancy . Pancolitis. S/p vaginal delivery in 10/2017 . Commenced on steroids, Rowasa and Lialda.  Presently clinically in clinical and biochemical remission. Off Rowasa and almost off steroids. .   Plan  1.  Check Hep A/B immune status and will need vaccination after. Check Vitamin D levels. Will need  Tdap/flu/pneumonia vaccine with her PCP . DEXA scan after she stops breast feeding . PAP smear per her GYN/PCP annually. She wishes not to be vaccinated per her beleifs and is aware of the risks 2 Continue steroids - decrease by 5 mg next 5 days till she is on 5 mg and see me on June 3rd at sigmoidoscopy   3. Continue Lialda 4 tablets, plan to decrease dose next visit 4. Colonoscopy after she stops breast feeding  5. Stop prednoisone after 4 more doses of 2.5 mg every other day.     Dr [name]  MD,MRCP [facility]) Follow up in 3 months

## 2018-01-10 ENCOUNTER — Other Ambulatory Visit: Payer: Self-pay | Admitting: Gastroenterology

## 2018-01-12 LAB — VITAMIN D 1,25 DIHYDROXY
VITAMIN D 1, 25 (OH) TOTAL: 38 pg/mL
Vitamin D3 1, 25 (OH)2: 35 pg/mL

## 2018-01-12 LAB — HEPATITIS A ANTIBODY, TOTAL: Hep A Total Ab: NEGATIVE

## 2018-01-12 LAB — HEPATITIS B SURFACE ANTIBODY,QUALITATIVE: HEP B SURFACE AB, QUAL: NONREACTIVE

## 2018-01-14 ENCOUNTER — Telehealth: Payer: Self-pay

## 2018-01-14 NOTE — Telephone Encounter (Signed)
-----   Message from Wyline MoodKiran Anna, MD sent at 01/12/2018  3:25 PM EDT ----- Nolen MuPanya inform normal vitamin D levels. Am aware of her religious beliefs but would inform her that she is not immune to hep A/B, she is immunocompromised and my medical recommendation would be to get vaccinated to prevent a severe infection.    C/c Nadara MustardHarris, Robert P, MD   Dr Wyline MoodKiran Anna MD,MRCP [facility](U.K) Gastroenterology/Hepatology Pager: [phone]

## 2018-01-14 NOTE — Telephone Encounter (Signed)
Advised patient of results per Dr. Tobi BastosAnna:   - inform normal vitamin D levels. Am aware of her religious beliefs but would inform her that she is not immune to hep A/B, she is immunocompromised and my medical recommendation would be to get vaccinated to prevent a severe infection.  Patient expresses understanding.

## 2018-02-07 ENCOUNTER — Other Ambulatory Visit: Payer: Self-pay | Admitting: Gastroenterology

## 2018-02-11 ENCOUNTER — Telehealth: Payer: Self-pay | Admitting: Gastroenterology

## 2018-02-11 ENCOUNTER — Other Ambulatory Visit: Payer: Self-pay

## 2018-02-11 MED ORDER — MESALAMINE 1.2 G PO TBEC: DELAYED_RELEASE_TABLET | ORAL | 0 refills | Status: DC

## 2018-02-11 NOTE — Telephone Encounter (Signed)
Medication has been refilled and sent to Pharmacy, pt has been notified  

## 2018-02-11 NOTE — Telephone Encounter (Signed)
Pt left vm she needs rx refill on Lealda she already contacted her pharmacy and they faxed us authorization she needs rx for tomorrow dosage

## 2018-03-09 ENCOUNTER — Other Ambulatory Visit: Payer: Self-pay | Admitting: Gastroenterology

## 2018-03-14 ENCOUNTER — Telehealth: Payer: Self-pay | Admitting: Gastroenterology

## 2018-03-14 NOTE — Telephone Encounter (Signed)
Pt is calling she states Medicaid is no longer authorizing rx Monica Drake unless we send Medicaid a note that states pt needs rx

## 2018-03-14 NOTE — Telephone Encounter (Signed)
I am writing this letter to request authorization on Lialda for my patient whom I recently diagnosed with severe ulcerative pan colitis . She was started on Rowasa and Lialda and did incredibly well and was heading towards clinical remission . I strongly request authorization for these medications for her to continue,  failing which she stands a high chance of relapse and getting sick .   If you require any further information , I would be happy to provide the same

## 2018-03-14 NOTE — Telephone Encounter (Signed)
Dr. Tobi BastosAnna,   Can you draft something for me to put in letter form to [facility] to be able to continue taking the Lialda?   Thanks!

## 2018-03-20 ENCOUNTER — Telehealth: Payer: Self-pay

## 2018-03-20 NOTE — Telephone Encounter (Signed)
Yes, we do currently have Apriso samples.

## 2018-03-20 NOTE — Telephone Encounter (Signed)
Ginger - Ok let her use her lialda and when runs out lets give her Apriso 1.5 grams once daily in the morning.   Lets try get some patient assistance if possible - is there anything else we can do to get her reasonably priced any mesalamine prep ?

## 2018-03-20 NOTE — Telephone Encounter (Signed)
Pt notified she may take her Lialda BID per Dr. Tobi BastosAnna. She will stop by and get samples of Apriso if she runs out prior to getting pt assistance for Lialda. Pt will print out forms, fill out and drop them off in the [place] office.

## 2018-03-20 NOTE — Telephone Encounter (Signed)
Is there an alternative medication? Till we find out yes can decrease to two times daily. Do we have any Aprisio samples?

## 2018-03-20 NOTE — Telephone Encounter (Signed)
Pt is currently taking Lialda qid. She has medicaid family planning which does not pay for her medications. She cannot afford the out of pocket expense. She is wanting to know if she can reduce taking this to twice daily to make what she currently has last. I am looking into patient assistance to see what she will qualify for. In the meantime, please advise on change.

## 2018-04-02 ENCOUNTER — Other Ambulatory Visit: Payer: Medicaid Other

## 2018-04-03 ENCOUNTER — Ambulatory Visit: Payer: Medicaid Other | Admitting: Oncology

## 2018-04-03 ENCOUNTER — Ambulatory Visit: Payer: Medicaid Other

## 2018-04-07 ENCOUNTER — Inpatient Hospital Stay: Payer: Self-pay

## 2018-04-10 ENCOUNTER — Encounter: Payer: Self-pay | Admitting: Gastroenterology

## 2018-04-10 ENCOUNTER — Encounter

## 2018-04-10 ENCOUNTER — Ambulatory Visit: Payer: Medicaid Other | Admitting: Oncology

## 2018-04-10 ENCOUNTER — Other Ambulatory Visit
Admission: RE | Admit: 2018-04-10 | Discharge: 2018-04-10 | Disposition: A | Discharge status: Home / Self Care | Payer: Medicaid Other | Source: Ambulatory Visit | Attending: Nephrology | Admitting: Nephrology

## 2018-04-10 ENCOUNTER — Ambulatory Visit (INDEPENDENT_AMBULATORY_CARE_PROVIDER_SITE_OTHER): Payer: Self-pay | Admitting: Gastroenterology

## 2018-04-10 ENCOUNTER — Ambulatory Visit: Payer: Medicaid Other

## 2018-04-10 VITALS — BP 103/65 | HR 83 | Ht 66.0 in | Wt 124.2 lb

## 2018-04-10 DIAGNOSIS — K5190 Ulcerative colitis, unspecified, without complications: Secondary | ICD-10-CM

## 2018-04-10 LAB — T4, FREE: Free T4: 1.33 ng/dL (ref 0.82–1.77)

## 2018-04-10 LAB — FOLATE: Folate: >100 ng/mL (ref >5.9)

## 2018-04-10 LAB — TSH: TSH: 1.305 u[IU]/mL (ref 0.350–4.500)

## 2018-04-10 NOTE — Progress Notes (Signed)
[name] MD, MRCP(U.K) [address]  [address]  [place], [place] [address]  Main: [phone]  Fax: [phone]   Primary Care Physician: Nadara Mustard, MD  Primary Gastroenterologist:  Dr. [name]   No chief complaint on file.   HPI: Monica Drake is a 37 y.o. female   Summary of history : She is here to follow up . She was seen initially with a history of rectal bleeding 05/2017. She waspregnant. A flexible sigmoidoscopy performed on 11/11/17 revealed moderate colitis and biopsies confirm ulcerative colitis with no HSV/CMV seen. I commenced her on steroids4/2019 .MRI abdomen showed no abnormalities.  She had a vaginal deliver while on steroids and while she was  in clinical remission. Baby and mother did well. 12/30/17- sigmoidoscopy- normal appearing mucosa  Labs 01/01/18 - normal iron studies. Hb 13 grams   CRP 12/10/17- 2.6  Interval history6/11/19-9/12/19  Labs 12/2017 : normal vitamin D levels. she is not immune to hep A/B  Says she is losing some hair, having some headaches. Feels a bit depressed. On 2 tablets of Lialda since September 3rd. She has 1 bowel movement every two days.   She has noticed if she drinks milk , has abdominal discomfort. Almond milk does fine.   Current Outpatient Medications  Medication Sig Dispense Refill  . acetaminophen (TYLENOL) 325 MG tablet Take 2 tablets (650 mg total) by mouth every 4 (four) hours as needed for mild pain or moderate pain (for pain scale < 4). 60 tablet 1  . ferrous sulfate 325 (65 FE) MG EC tablet Take 1 tablet (325 mg total) by mouth 2 (two) times daily with a meal. 60 tablet 3  . mesalamine (LIALDA) 1.2 g EC tablet TAKE 4 TABLETS BY MOUTH DAILY WITH BREAKFAST 120 tablet 0  . predniSONE (DELTASONE) 2.5 MG tablet Take 2 tablets daily for 4 days then 1 tablet daily for 4 days and then 1 tablet every other day for 6 days then stop 14 tablet 0  . prenatal vitamin w/FE, FA (NATACHEW) 29-1 MG CHEW  chewable tablet Chew 1 tablet by mouth daily at 12 noon.     No current facility-administered medications for this visit.     Allergies as of 04/10/2018  . (No Known Allergies)    ROS:  General: Negative for anorexia, weight loss, fever, chills, fatigue, weakness. ENT: Negative for hoarseness, difficulty swallowing , nasal congestion. CV: Negative for chest pain, angina, palpitations, dyspnea on exertion, peripheral edema.  Respiratory: Negative for dyspnea at rest, dyspnea on exertion, cough, sputum, wheezing.  GI: See history of present illness. GU:  Negative for dysuria, hematuria, urinary incontinence, urinary frequency, nocturnal urination.  Endo: Negative for unusual weight change.    Physical Examination:   There were no vitals taken for this visit.  General: Well-nourished, well-developed in no acute distress.  Eyes: No icterus. Conjunctivae pink. Mouth: Oropharyngeal mucosa moist and pink , no lesions erythema or exudate. Lungs: Clear to auscultation bilaterally. Non-labored. Heart: Regular rate and rhythm, no murmurs rubs or gallops.  Abdomen: Bowel sounds are normal, nontender, nondistended, no hepatosplenomegaly or masses, no abdominal bruits or hernia , no rebound or guarding.   Extremities: No lower extremity edema. No clubbing or deformities. Neuro: Alert and oriented x 3.  Grossly intact. Skin: Warm and dry, no jaundice.   Psych: Alert and cooperative, normal mood and affect.   Imaging Studies: No results found.  Assessment and Plan:   Monica Drake is a 37  y.o. y/o female  here to follow up after a new diagnosis of ulcerative colitis which was made during her pregnancy . Pancolitis. S/p vaginal delivery in 10/2017 . Clinically and endoscopically in remission   Plan 1.I have recommended Hep A/B vaccine as she is not immune, she is not  keen due to religious beliefs.  Will need Tdap/flu/pneumonia vaccine with her PCP . DEXA scan after she stops breast  feeding . PAP smear per her GYN/PCP annually. She wishes not to be vaccinated per her beleifs and is aware of the risks 2Continue Lialda2 tablets 4.Colonoscopy after she stops breast feeding  5. Check Vitamin D levels, TSH,B12 6. She feels depressed at times, fatigue, headaches, advised to discuss with PCP may be related to post partum depression, loss of hair may be due to stressful event of delivery and likely will improve. If headaches continue may need to see a neurologist   Dr Wyline MoodKiran Belladonna Lubinski  MD,MRCP [facility](U.K) Follow up in 6 months

## 2018-04-11 LAB — VITAMIN B12: Vitamin B-12: 1458 pg/mL — ABNORMAL HIGH (ref 180–914)

## 2018-04-11 LAB — VITAMIN D 25 HYDROXY (VIT D DEFICIENCY, FRACTURES): Vit D, 25-Hydroxy: 108 ng/mL — ABNORMAL HIGH (ref 30.0–100.0)

## 2018-04-11 LAB — T3, FREE: T3 FREE: 2.7 pg/mL (ref 2.0–4.4)

## 2018-04-11 LAB — C-REACTIVE PROTEIN

## 2018-05-05 ENCOUNTER — Other Ambulatory Visit: Payer: Self-pay

## 2018-05-06 ENCOUNTER — Encounter: Payer: Self-pay | Admitting: Pharmacy Technician

## 2018-05-06 NOTE — Progress Notes (Unsigned)
Patient has been approved for drug assistance by [employer] for Venofer. The enrollment period is from 05/05/18-05/05/19 based on self-pay.

## 2018-05-08 ENCOUNTER — Ambulatory Visit: Payer: Medicaid Other

## 2018-05-08 ENCOUNTER — Ambulatory Visit: Payer: Medicaid Other | Admitting: Oncology

## 2018-06-02 ENCOUNTER — Other Ambulatory Visit: Payer: Self-pay

## 2018-06-05 ENCOUNTER — Ambulatory Visit: Payer: Self-pay

## 2018-06-05 ENCOUNTER — Ambulatory Visit: Payer: Self-pay | Admitting: Oncology

## 2018-08-05 ENCOUNTER — Other Ambulatory Visit: Payer: Self-pay | Admitting: *Deleted

## 2018-08-05 DIAGNOSIS — D508 Other iron deficiency anemias: Secondary | ICD-10-CM

## 2018-08-11 ENCOUNTER — Other Ambulatory Visit: Payer: Self-pay

## 2018-08-14 ENCOUNTER — Ambulatory Visit: Payer: Self-pay

## 2018-08-14 ENCOUNTER — Ambulatory Visit: Payer: Self-pay | Admitting: Hematology and Oncology

## 2018-11-21 IMAGING — MR MR PELVIS W/O CM
6 of 11 series · 21 of 48 positions shown · non-contrast
Comparison: None.

CLINICAL DATA: 36-year-old pregnant female at 35 weeks gestation.
Bloody diarrhea several times a day for several months.

EXAM:
MRI ABDOMEN AND PELVIS WITHOUT CONTRAST
TECHNIQUE: Multiplanar multisequence MR imaging of the abdomen and pelvis was
performed. No intravenous contrast was administered.

[Series 3: T2 · coronal · 8.0mm · 0.84mm/px · 2 of 27 slices shown (1 of 3)]
[im 1/27]
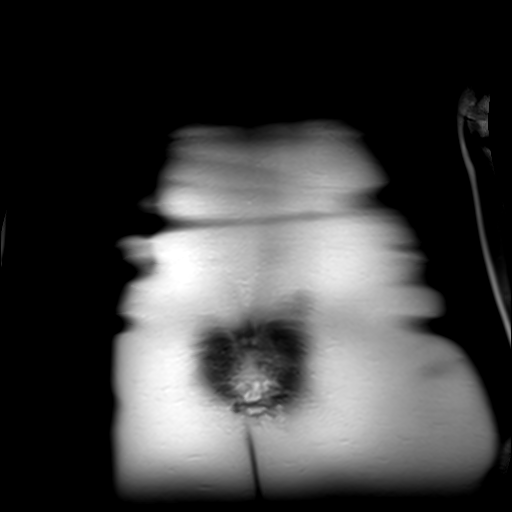
[im 27/27]
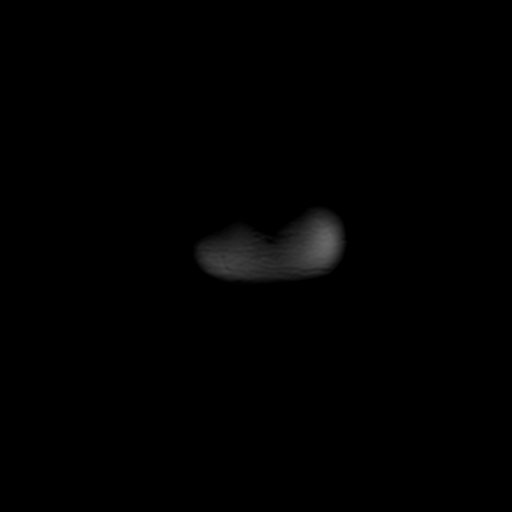

[Series 5: T2 fat-sat · axial · 8.0mm · 0.74mm/px · z∈[-121,+129]mm · 2 of 27 slices shown]
[im 1/27]
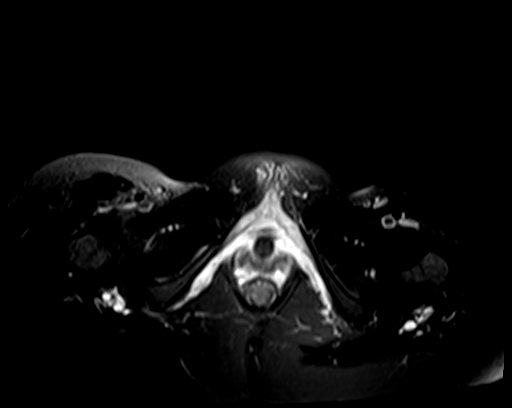
[im 27/27]
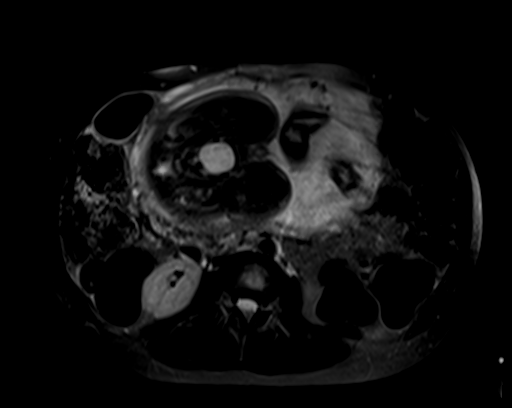

[Series 6: T2 · axial · 8.0mm · 0.74mm/px · z∈[-125,+134]mm · 2 of 28 slices shown (2 of 3)]
[im 1/28]
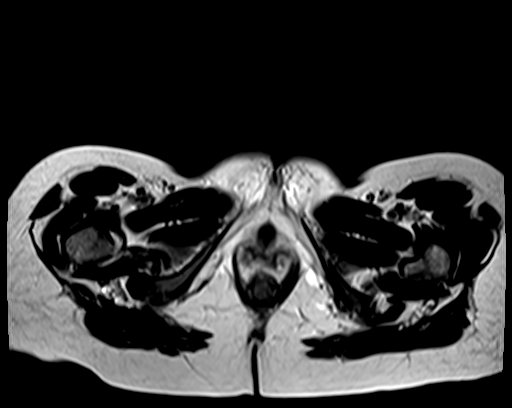
[im 28/28]
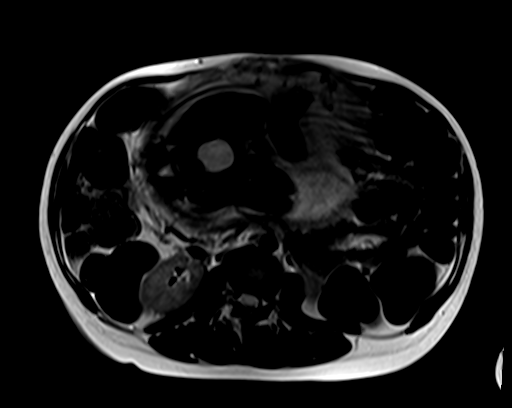

[Series 7: T2 · sagittal · 8.0mm · 0.74mm/px · 3 of 34 slices shown (3 of 3)]
[im 1/34]
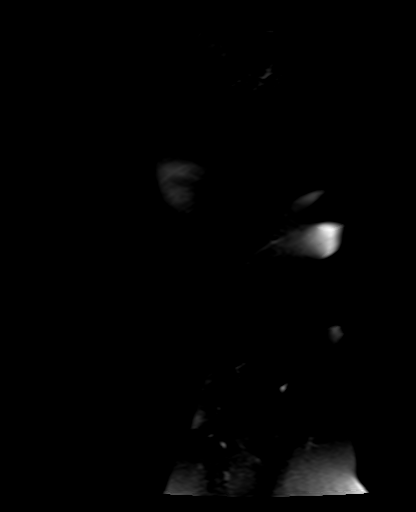
[im 17/34]
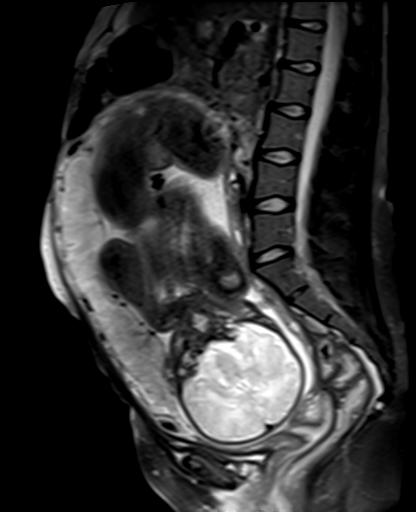
[im 34/34]
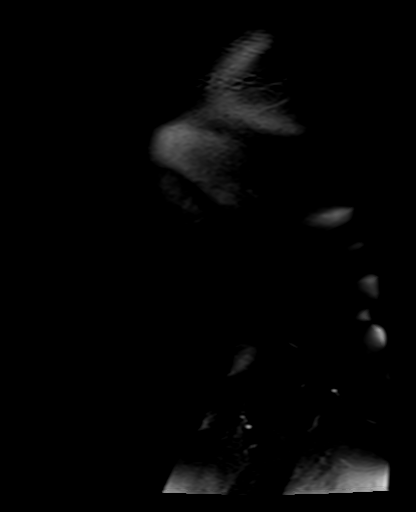

[Series 8: T1 · axial · 2.5mm · 0.74mm/px · z∈[-119,+138]mm · 8 of 104 slices shown]
[im 1/104]
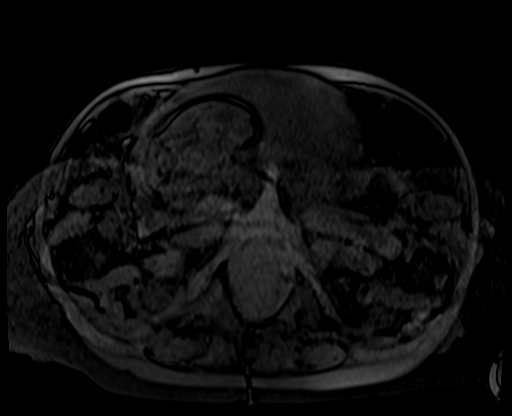
[im 13/104]
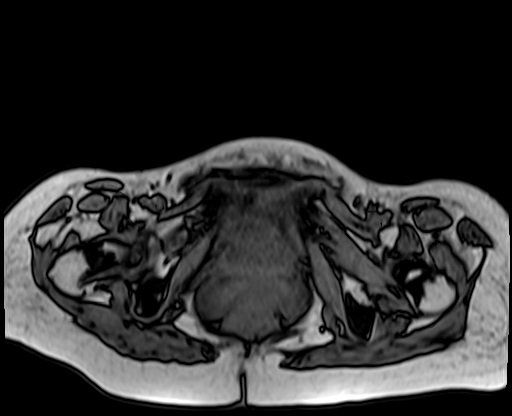
[im 26/104]
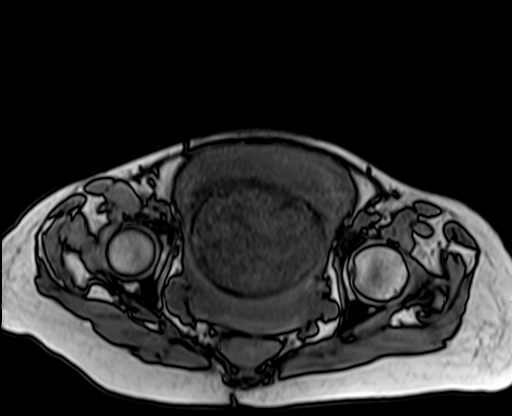
[im 39/104]
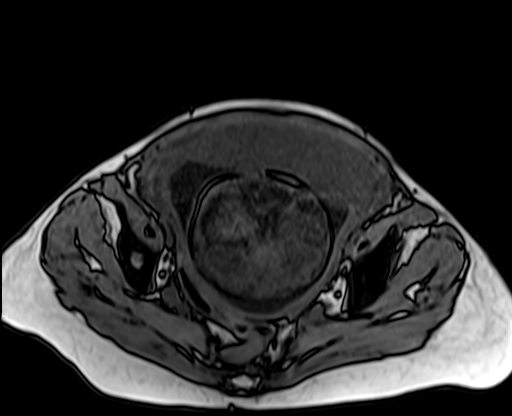
[im 65/104]
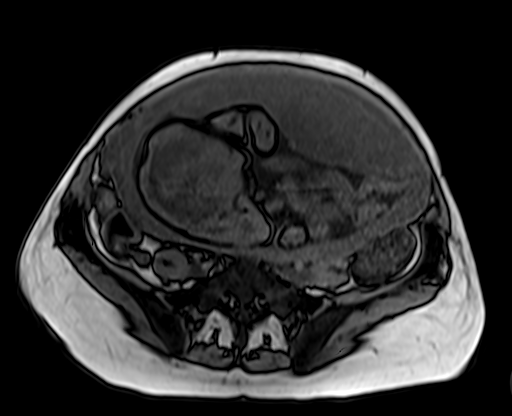
[im 78/104]
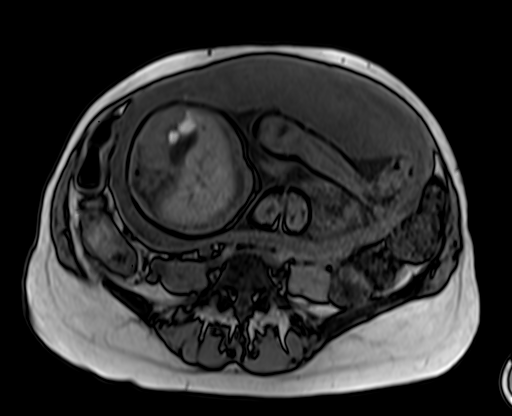
[im 91/104]
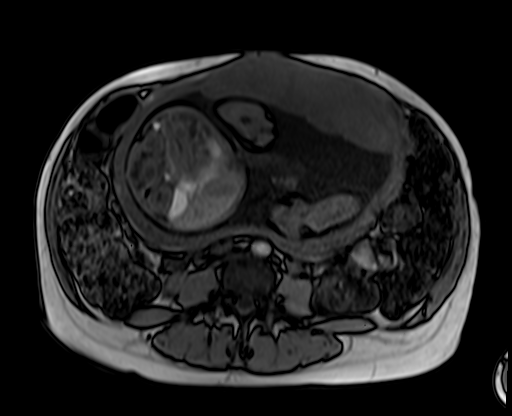
[im 104/104]
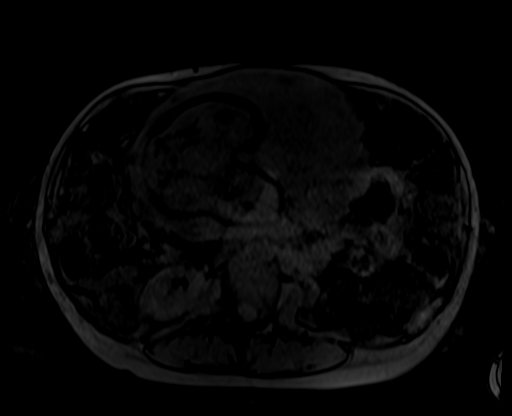

[Series 9: T1 fat-sat · axial · non-contrast · 2.5mm · 0.74mm/px · z∈[-119,-24]mm · 4 of 104 slices shown]
[im 1/104]
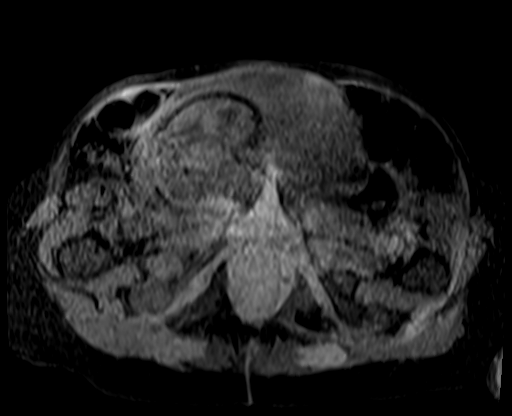
[im 13/104]
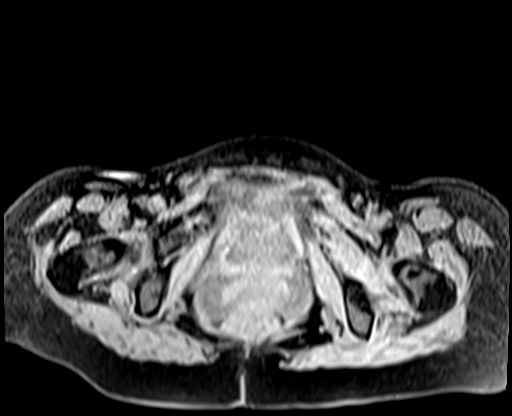
[im 26/104]
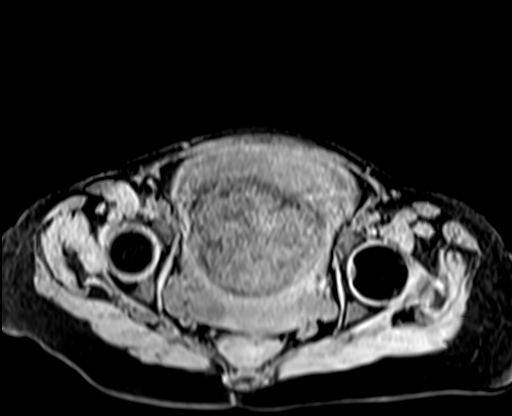
[im 39/104]
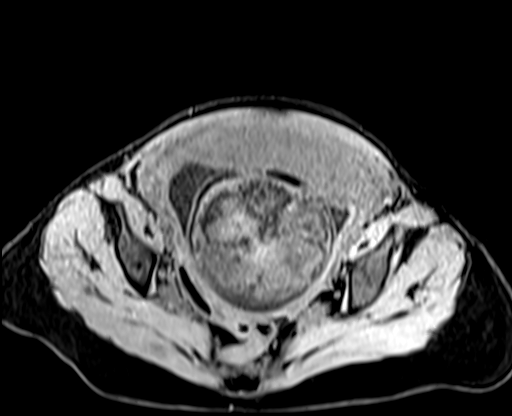

[21 of 48 positions shown; findings below may reference images not displayed]

FINDINGS: COMBINED FINDINGS FOR BOTH MR ABDOMEN AND PELVIS

Lower chest: No acute abnormality at the lung bases.

Hepatobiliary: Normal liver size and configuration. No hepatic
steatosis. No liver mass. No cholelithiasis. Layering gallbladder
sludge in the nondistended gallbladder. No gallbladder wall
thickening or pericholecystic fluid. No biliary ductal dilatation.
Common bile duct diameter 3 mm. No choledocholithiasis.

Pancreas: No pancreatic mass or duct dilation.  No pancreas divisum.

Spleen: Normal size pancreas. A few tiny T2 hyperintense lesions are
scattered in the spleen, largest 0.5 cm (series 11/image 11), which
are incompletely characterized on this noncontrast MRI study and are
probably benign.

Adrenals/Urinary Tract: Normal adrenals. No hydronephrosis. There is
a 2.7 x 2.5 cm complex renal cyst in the posterior interpolar right
kidney (series 12/image 17) with heterogeneous signal intensity on
T2 sequences and isointensity on T1 sequences, incompletely
characterized on this noncontrast MRI. No additional renal lesions.
Normal collapsed bladder.

Stomach/Bowel: Grossly normal stomach. Normal caliber small bowel,
with no small bowel wall thickening. Appendix not discretely
visualized. No pericecal inflammatory changes. Large volume of stool
in the right, transverse and descending colon. Sigmoid colon and
rectum are collapsed. No convincing large bowel wall thickening or
pericolonic inflammatory changes.

Vascular/Lymphatic: Normal caliber abdominal aorta. No
pathologically enlarged lymph nodes in the abdomen or pelvis.

Reproductive: Enlarged gravid uterus with single intrauterine
gestation in cephalic lie. Uterine cavity extends superiorly to the
L2 vertebral level. This MRI study is not tailored for fetal
evaluation. Anterior placenta with no evidence of placental
abruption or previa. No appreciable uterine fibroids. Cervix length
2.8 cm. No evidence of internal cervical funneling. Amniotic fluid
volume appears subjectively normal.

Other: No abdominal ascites or focal fluid collection.

Musculoskeletal: No aggressive appearing focal osseous lesions.
IMPRESSION: 1. Large stool volume in the right, transverse and descending colon,
suggesting constipation. Sigmoid colon and rectum are collapsed. No
evidence of bowel obstruction or acute bowel inflammation.
2. Complex 2.7 cm renal cyst in the posterior interpolar right
kidney, incompletely characterized on this noncontrast MRI study.
Statistically in a patient of this age, a hemorrhagic/proteinaceous
Bosniak category 2 renal cyst is most likely. Renal neoplasm cannot
be strictly excluded on the basis of this scan. Advise postpartum
follow-up with either renal ultrasound or MRI abdomen without and
with IV contrast.
3. Enlarged gravid uterus with single intrauterine gestation in
cephalic lie. This MRI study is not tailored for fetal evaluation.
No evidence of acute gestational abnormality. Cervix length 2.8 cm
without internal cervical funneling. Subjectively normal amniotic
fluid volume.

## 2018-11-28 ENCOUNTER — Encounter: Payer: Self-pay | Admitting: Hematology and Oncology

## 2018-12-01 ENCOUNTER — Other Ambulatory Visit: Payer: Self-pay

## 2018-12-04 ENCOUNTER — Ambulatory Visit: Payer: Self-pay

## 2018-12-04 ENCOUNTER — Ambulatory Visit: Payer: Self-pay | Admitting: Hematology and Oncology

## 2018-12-29 ENCOUNTER — Other Ambulatory Visit: Payer: Self-pay

## 2018-12-31 ENCOUNTER — Ambulatory Visit: Payer: Self-pay | Admitting: Hematology and Oncology

## 2018-12-31 ENCOUNTER — Ambulatory Visit: Payer: Self-pay

## 2019-01-12 ENCOUNTER — Other Ambulatory Visit: Payer: Self-pay

## 2019-01-12 ENCOUNTER — Ambulatory Visit
Admission: EM | Admit: 2019-01-12 | Discharge: 2019-01-12 | Disposition: A | Discharge status: Home / Self Care | Payer: Self-pay | Attending: Emergency Medicine | Admitting: Emergency Medicine

## 2019-01-12 DIAGNOSIS — H6123 Impacted cerumen, bilateral: Secondary | ICD-10-CM

## 2019-01-12 NOTE — ED Triage Notes (Signed)
Patient complains of swimmers ear to right ear after swimming on Wednesday.

## 2019-01-12 NOTE — ED Provider Notes (Signed)
[facility]    CSN: [id number] Arrival date & time: 01/12/19  1101     History   Chief Complaint Chief Complaint  Patient presents with  . Otitis Externa    HPI Monica Drake is a 38 y.o. female.   Patient presents with right ear pain x 5 days. OTC ear wax removal drops tried to no avail. No drainage from ear, but pain is consistent and is now causing a headache. Pt reports decrease of hearing as well. No recent antibiotics.   The history is provided by the patient. No language interpreter was used.    Past Medical History:  Diagnosis Date  . Foot fracture, right   . Hypoglycemia   . Iron deficiency anemia 11/21/2017  . Pregnant and not yet delivered in third trimester   . Ulcerative colitis ([place]) 11/11/2017    Patient Active Problem List   Diagnosis Date Noted  . Postpartum care following vaginal delivery 11/23/2017  . Iron deficiency anemia 11/21/2017  . PROM (premature rupture of membranes) 11/21/2017  . Ulcerative pancolitis with rectal bleeding ([place]) 11/11/2017  . Right renal mass 11/04/2017  . Anemia of pregnancy 10/07/2017  . 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 Grade I hemorrhoids 07/15/2017  . High-risk pregnancy 06/21/2015    Past Surgical History:  Procedure Laterality Date  . ECTOPIC PREGNANCY SURGERY  02/27/2005  . FLEXIBLE SIGMOIDOSCOPY N/A 11/11/2017   Procedure: FLEXIBLE SIGMOIDOSCOPY;  Surgeon: Jonathon Bellows, MD;  Location[facility] ENDOSCOPY;  Service: Gastroenterology;  Laterality: N/A;  Please use tap water enema prior to procedure. Note: Patient is hypoglycemic  . FLEXIBLE SIGMOIDOSCOPY N/A 12/30/2017   Procedure: FLEXIBLE SIGMOIDOSCOPY;  Surgeon: Jonathon Bellows, MD;  Location: [facility] ENDOSCOPY;  Service: Gastroenterology;  Laterality: N/A;  . HERNIA REPAIR      OB History    Gravida  6   Para  5   Term  5   Preterm      AB  1   Living  5     SAB  1   TAB      Ectopic      Multiple  0    Live Births  5            Home Medications    Prior to Admission medications   Medication Sig Start Date End Date Taking? Authorizing Provider  ENTERAGAM 5 g PACK OPEN AND DISSOLVE 1 PACKET AS DIRECTED AND TAKE ENTIRE CONTENTS TWICE DAILY 12/26/18  Yes [provider]  prenatal vitamin w/FE, FA (NATACHEW) 29-1 MG CHEW chewable tablet Chew 1 tablet by mouth daily at 12 noon.   Yes [provider]  acetaminophen (TYLENOL) 325 MG tablet Take 2 tablets (650 mg total) by mouth every 4 (four) hours as needed for mild pain or moderate pain (for pain scale < 4). 11/23/17   Dalia Heading, CNM  ferrous sulfate 325 (65 FE) MG EC tablet Take 1 tablet (325 mg total) by mouth 2 (two) times daily with a meal. 01/02/18   Earlie Server, MD  mesalamine (LIALDA) 1.2 g EC tablet TAKE 4 TABLETS BY MOUTH DAILY WITH BREAKFAST 03/10/18   Lin Landsman, MD  predniSONE (DELTASONE) 2.5 MG tablet Take 2 tablets daily for 4 days then 1 tablet daily for 4 days and then 1 tablet every other day for 6 days then stop Patient not taking: Reported on 04/10/2018 12/30/17   Jonathon Bellows, MD    Family History Family History  Problem Relation  Age of Onset  . Diabetes Mother   . Hypertension Mother   . Arthritis Mother   . Heart disease Mother   . Hypertension Father   . Arthritis Father   . Birth defects Sister     Social History Social History   Tobacco Use  . Smoking status: Never Smoker  . Smokeless tobacco: Never Used  Substance Use Topics  . Alcohol use: No  . Drug use: No     Allergies   Patient has no known allergies.   Review of Systems Review of Systems  HENT: Positive for ear pain and hearing loss. Negative for ear discharge.   Neurological: Positive for headaches.  All other systems reviewed and are negative.    Physical Exam Triage Vital Signs ED Triage Vitals  Enc Vitals Group     BP 01/12/19 1138 101/71     Pulse Rate 01/12/19 1138 76     Resp 01/12/19 1138 16      Temp 01/12/19 1138 98.4 F (36.9 C)     Temp Source 01/12/19 1138 Oral     SpO2 01/12/19 1138 100 %     Weight 01/12/19 1136 116 lb (52.6 kg)     Height 01/12/19 1136 5\' 6"  (1.676 m)     Head Circumference --      Peak Flow --      Pain Score 01/12/19 1136 4     Pain Loc --      Pain Edu? --      Excl. in GC? --    No data found.  Updated Vital Signs BP 101/71 (BP Location: Left Arm)   Pulse 76   Temp 98.4 F (36.9 C) (Oral)   Resp 16   Ht 5\' 6"  (1.676 m)   Wt 116 lb (52.6 kg)   LMP 01/05/2019   SpO2 100%   Breastfeeding Yes   BMI 18.72 kg/m   Physical Exam Vitals signs reviewed.  HENT:     Right Ear: There is impacted cerumen.     Left Ear: There is impacted cerumen.  Neurological:     Mental Status: She is alert.  Psychiatric:        Thought Content: Thought content normal.        Judgment: Judgment normal.      UC Treatments / Results  Labs (all labs ordered are listed, but only abnormal results are displayed) Labs Reviewed - No data to display  EKG None  Radiology No results found.  Procedures Ear Cerumen Removal  Date/Time: 01/12/2019 1:35 PM Performed by: Bailey MechBenjamin, Stepehn Eckard, NP Authorized by: Bailey MechBenjamin, Garet Hooton, NP   Consent:    Consent obtained:  Verbal   Consent given by:  Patient   Risks discussed:  Pain, incomplete removal and dizziness   Alternatives discussed:  No treatment Procedure details:    Location:  L ear and R ear   Procedure type: irrigation   Post-procedure details:    Inspection:  TM intact and bleeding   Patient tolerance of procedure:  Tolerated well, no immediate complications Comments:     Pt reported dizziness and pain during procedure, but has since resolved once procedure was completed.    (2+ hours)  Medications Ordered in UC Medications - No data to display  Initial Impression / Assessment and Plan / UC Course  I have reviewed the triage vital signs and the nursing notes.  Pertinent labs & imaging  results that were available during my care of the patient were  reviewed by me and considered in my medical decision making (see chart for details).   Pt presents with pain to right ear post swimming 5 days ago. Bilateral cerumen impaction noted during exam. Ears able to get fully irrigated during visits. Post irrigation assessment showed normal ear canals with mold bleeding to right TM. Pt reports resolution of pain to right ear and increase hearing. No follow-up'/further treatment needed at this time. All questions answered and all concerns addressed.   Final Clinical Impressions(s) / UC Diagnoses   Final diagnoses:  Bilateral impacted cerumen   Discharge Instructions   None    ED Prescriptions    None       Bailey MechBenjamin, Carreen Milius, NP 01/12/19 1345

## 2019-03-05 ENCOUNTER — Ambulatory Visit: Payer: Self-pay

## 2019-03-05 ENCOUNTER — Ambulatory Visit: Payer: Self-pay | Admitting: Hematology and Oncology

## 2019-07-31 NOTE — L&D Delivery Note (Signed)
Vaginal Delivery Note  Spontaneous delivery of live viable female infant from the LOA position through an intact perineum. Delivery of anterior right shoulder with gentle downward guidance followed by delivery of the left posterior shoulder with gentle upward guidance. Body followed spontaneously. Infant placed on maternal chest. Nursery present and helped with neonatal resuscitation and evaluation. Cord clamped and cut after one minute. Cord blood collected. Placenta delivered spontaneously and intact with a 3 vessel cord.  1st degree perineal laceration. Uterus firm and below umbilicus at the end of the delivery.  Mom and baby recovering in stable condition. Sponge and needle counts were correct at the end of the delivery.  APGARS: 1 minute:8   5 minutes: 9 Weight: pending No epidural  Adelene Idler MD [facility], [facility] 06/24/20 7:56 PM

## 2019-12-04 ENCOUNTER — Encounter: Payer: Self-pay | Admitting: Obstetrics & Gynecology

## 2019-12-18 ENCOUNTER — Encounter: Payer: Self-pay | Admitting: Advanced Practice Midwife

## 2020-01-25 LAB — OB RESULTS CONSOLE GC/CHLAMYDIA
Chlamydia: NEGATIVE
Gonorrhea: NEGATIVE

## 2020-01-25 LAB — OB RESULTS CONSOLE RPR: RPR: NONREACTIVE

## 2020-01-25 LAB — OB RESULTS CONSOLE HIV ANTIBODY (ROUTINE TESTING): HIV: NONREACTIVE

## 2020-01-25 LAB — OB RESULTS CONSOLE RUBELLA ANTIBODY, IGM: Rubella: IMMUNE

## 2020-01-25 LAB — OB RESULTS CONSOLE HEPATITIS B SURFACE ANTIGEN: Hepatitis B Surface Ag: NEGATIVE

## 2020-04-25 ENCOUNTER — Encounter: Payer: Self-pay | Admitting: Obstetrics and Gynecology

## 2020-04-25 ENCOUNTER — Other Ambulatory Visit: Payer: Self-pay

## 2020-04-25 ENCOUNTER — Ambulatory Visit (INDEPENDENT_AMBULATORY_CARE_PROVIDER_SITE_OTHER): Payer: Medicaid Other | Admitting: Obstetrics and Gynecology

## 2020-04-25 VITALS — BP 114/64 | Wt 146.0 lb

## 2020-04-25 DIAGNOSIS — Z3A28 28 weeks gestation of pregnancy: Secondary | ICD-10-CM

## 2020-04-25 DIAGNOSIS — O0993 Supervision of high risk pregnancy, unspecified, third trimester: Secondary | ICD-10-CM

## 2020-04-25 DIAGNOSIS — O360990 Maternal care for other rhesus isoimmunization, unspecified trimester, not applicable or unspecified: Secondary | ICD-10-CM

## 2020-04-25 DIAGNOSIS — Z113 Encounter for screening for infections with a predominantly sexual mode of transmission: Secondary | ICD-10-CM

## 2020-04-25 DIAGNOSIS — O09529 Supervision of elderly multigravida, unspecified trimester: Secondary | ICD-10-CM

## 2020-04-25 DIAGNOSIS — R8271 Bacteriuria: Secondary | ICD-10-CM

## 2020-04-25 MED ORDER — ACCU-CHEK AVIVA PLUS W/DEVICE KIT: PACK | 0 refills | Status: DC

## 2020-04-25 MED ORDER — ACCU-CHEK SOFTCLIX LANCETS MISC: 1.0000 | Freq: Four times a day (QID) | 0 refills | Status: DC

## 2020-04-25 MED ORDER — GLUCOSE BLOOD VI STRP: ORAL_STRIP | 12 refills | Status: DC

## 2020-04-25 NOTE — Progress Notes (Signed)
Transfer [employer] center

## 2020-04-25 NOTE — Progress Notes (Signed)
New Obstetric Patient H&P    Chief Complaint: "Desires prenatal care"   History of Present Illness: Patient is a 39 y.o. [id number] Not Hispanic or Latino female, presents with amenorrhea and positive home pregnancy test. No LMP recorded. Patient is pregnant. and based on her  LMP, her EDD is Estimated Date of Delivery: None noted. and her EGA is Unknown. Cycles are regular monthly. Her last pap smear was 07/15/2017 NILM HPV negative    Her past medical history is contibutory for prior postpartum depression, ulcerative colitis. Her prior pregnancies are notable for anti-E antibodies  Since her LMP, she admits to the use of tobacco products  no There are cats in the home in the home  Yes, indoor She admits close contact with children on a regular basis  yes  She has had chicken pox in the past no She has had Tuberculosis exposures, symptoms, or previously tested positive for TB   no Current or past history of domestic violence. no  Genetic Screening/Teratology Counseling: (Includes patient, baby's father, or anyone in either family with:)   1. Patient's age >/= 36 at [facility]  yes 2. Thalassemia ([place], [place], Mediterranean, or Asian background): MCV<80  no 3. Neural tube defect (meningomyelocele, spina bifida, anencephaly)  no 4. Congenital heart defect  no  5. Down syndrome  no 6. Tay-Sachs (Jewish, [place]  no 7. Canavan's Disease  no 8. Sickle cell disease or trait (African)  no  9. Hemophilia or other blood disorders  no  10. Muscular dystrophy  no  11. Cystic fibrosis  no  12. Huntington's Chorea  no  13. Mental retardation/autism  no 14. Other inherited genetic or chromosomal disorder  no 15. Maternal metabolic disorder (DM, PKU, etc)  no 16. Patient or FOB with a child with a birth defect not listed above no  16a. Patient or FOB with a birth defect themselves no 17. Recurrent pregnancy loss, or stillbirth  no  18. Any other genetic/environmental exposure to discuss   no  Infection History:   1. Lives with someone with TB or TB exposed  no  2. Patient or partner has history of genital herpes  no 3. Rash or viral illness since LMP  no 4. History of STI (GC, CT, HPV, syphilis, HIV)  no 5. History of recent travel :  no  Other pertinent information:  no     Review of Systems:10 point review of systems negative unless otherwise noted in HPI  Past Medical History:  Patient Active Problem List   Diagnosis Date Noted  . Postpartum care following vaginal delivery 11/23/2017  . Iron deficiency anemia 11/21/2017  . PROM (premature rupture of membranes) 11/21/2017  . Ulcerative pancolitis with rectal bleeding (HCC) 11/11/2017  . Right renal mass 11/04/2017    [ ]  Renal or MRI with contrast needed postpartum   . Anemia of pregnancy 10/07/2017    Start iron at 30 weeks   . 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  Monitor serial T&S if titer rises >1:16 MCA doppler and DP referral 1:1 titer initial NOB labs to weak to titer follow up 07/29/17   . Grade I hemorrhoids 07/15/2017  . High-risk pregnancy 06/21/2015    Clinic [facility] Prenatal Labs  Dating LMP = 10 week 06/23/2015 Blood type: O/Positive/-- (12/17 1059)   Genetic Screen Declines all genetic screening Antibody:Positive, See Final Results (  12/17 1059) Anti-E  Anatomic [place] Complete 07/29/17 Rubella: Immune Varicella: Immune  GTT Early: NA, Third trimester:  RPR: Non Reactive (12/17 1059)   Rhogam N/A HBsAg: Negative (12/17 1059)   TDaP vaccine              Flu Shot:Declines HIV: Non Reactive (12/17 1059)   Baby Food Breast                               GBS:   Contraception Declines Pap: 07/15/17 NIL HPV negative  CBB     CS/VBAC NA   Support Person Donney Rankins            Past Surgical History:  Past Surgical History:  Procedure Laterality Date  .  ECTOPIC PREGNANCY SURGERY  02/27/2005  . FLEXIBLE SIGMOIDOSCOPY N/A 11/11/2017   Procedure: FLEXIBLE SIGMOIDOSCOPY;  Surgeon: [name], MD;  Location[facility] ENDOSCOPY;  Service: Gastroenterology;  Laterality: N/A;  Please use tap water enema prior to procedure. Note: Patient is hypoglycemic  . FLEXIBLE SIGMOIDOSCOPY N/A 12/30/2017   Procedure: FLEXIBLE SIGMOIDOSCOPY;  Surgeon: [name], MD;  Location: [facility] ENDOSCOPY;  Service: Gastroenterology;  Laterality: N/A;  . HERNIA REPAIR      Gynecologic History: No LMP recorded. Patient is pregnant.  Obstetric History: [id number]  Family History:  Family History  Problem Relation Age of Onset  . Diabetes Mother   . Hypertension Mother   . Arthritis Mother   . Heart disease Mother   . Hypertension Father   . Arthritis Father   . Birth defects Sister     Social History:  Social History   Socioeconomic History  . Marital status: Married    Spouse name: Not on file  . Number of children: Not on file  . Years of education: Not on file  . Highest education level: Not on file  Occupational History  . Not on file  Tobacco Use  . Smoking status: Never Smoker  . Smokeless tobacco: Never Used  Vaping Use  . Vaping Use: Never used  Substance and Sexual Activity  . Alcohol use: No  . Drug use: No  . Sexual activity: Not Currently    Birth control/protection: Condom  Other Topics Concern  . Not on file  Social History Narrative  . Not on file   Social Determinants of Health   Financial Resource Strain:   . Difficulty of Paying Living Expenses: Not on file  Food Insecurity:   . Worried About Programme researcher, broadcasting/film/video in the Last Year: Not on file  . Ran Out of Food in the Last Year: Not on file  Transportation Needs:   . Lack of Transportation (Medical): Not on file  . Lack of Transportation (Non-Medical): Not on file  Physical Activity:   . Days of Exercise per Week: Not on file  . Minutes of Exercise per Session: Not on file    Stress:   . Feeling of Stress : Not on file  Social Connections:   . Frequency of Communication with Friends and Family: Not on file  . Frequency of Social Gatherings with Friends and Family: Not on file  . Attends Religious Services: Not on file  . Active Member of Clubs or Organizations: Not on file  . Attends Banker Meetings: Not on file  . Marital Status: Not on file  Intimate Partner Violence:   . Fear of Current or Ex-Partner: Not on file  .  Emotionally Abused: Not on file  . Physically Abused: Not on file  . Sexually Abused: Not on file    Allergies:  No Known Allergies  Medications: Prior to Admission medications   Medication Sig Start Date End Date Taking? Authorizing Provider  acetaminophen (TYLENOL) 325 MG tablet Take 2 tablets (650 mg total) by mouth every 4 (four) hours as needed for mild pain or moderate pain (for pain scale < 4). 11/23/17   Farrel Conners, CNM  ENTERAGAM 5 g PACK OPEN AND DISSOLVE 1 PACKET AS DIRECTED AND TAKE ENTIRE CONTENTS TWICE DAILY 12/26/18   [provider]  ferrous sulfate 325 (65 FE) MG EC tablet Take 1 tablet (325 mg total) by mouth 2 (two) times daily with a meal. 01/02/18   Rickard Patience, MD  mesalamine (LIALDA) 1.2 g EC tablet TAKE 4 TABLETS BY MOUTH DAILY WITH BREAKFAST 03/10/18   Toney Reil, MD  predniSONE (DELTASONE) 2.5 MG tablet Take 2 tablets daily for 4 days then 1 tablet daily for 4 days and then 1 tablet every other day for 6 days then stop Patient not taking: Reported on 04/10/2018 12/30/17   [name], MD  prenatal vitamin w/FE, FA (NATACHEW) 29-1 MG CHEW chewable tablet Chew 1 tablet by mouth daily at 12 noon.    [provider]    Physical Exam Vitals: Blood pressure 114/64, weight 146 lb (66.2 kg), currently breastfeeding.  General: NAD HEENT: normocephalic, anicteric Pulmonary: No increased work of breathing Abdomen: soft, non-tender, non-distended.  Gravid, fundal height 27cm,  heartones 145 Extremities: no edema, erythema, or tenderness Neurologic: Grossly intact Psychiatric: mood appropriate, affect full   Assessment: 39 y.o. [id number] at Unknown presenting to initiate prenatal care  Plan: 1) Avoid alcoholic beverages. 2) Patient encouraged not to smoke.  3) Discontinue the use of all non-medicinal drugs and chemicals.  4) Take prenatal vitamins daily.  5) Nutrition, food safety (fish, cheese advisories, and high nitrite foods) and exercise discussed. 6) Hospital and practice style discussed with cross coverage system.  7) Genetic Screening, such as with 1st Trimester Screening, cell free fetal DNA, AFP testing, and Ultrasound, as well as with amniocentesis and CVS as appropriate, is discussed with patient. At the conclusion of today's visit patient declined genetic testing 8) 28 week labs discussed - patient opts to forgo 1-hr glucola and check BG over the next week.  She does not have a history of GDM in prior pregnancies actually suffered from hypoglycemic episodes.  Will obtain remainder of 28 week labs today and review log in 1 week 9) Anti-E - repeat T&S today 19) Return in about 1 week (around 05/02/2020) for ROB and BG log review (can be phone).  Vena [place], MD, Evern Core [facility], [facility] 04/25/2020, 2:36 PM

## 2020-04-26 LAB — CBC
Hematocrit: 36 % (ref 34.0–46.6)
Hemoglobin: 12.2 g/dL (ref 11.1–15.9)
MCH: 31.6 pg (ref 26.6–33.0)
MCHC: 33.9 g/dL (ref 31.5–35.7)
MCV: 93 fL (ref 79–97)
Platelets: 293 10*3/uL (ref 150–450)
RBC: 3.86 x10E6/uL (ref 3.77–5.28)
RDW: 13.1 % (ref 11.7–15.4)
WBC: 7.8 10*3/uL (ref 3.4–10.8)

## 2020-04-26 LAB — ABO

## 2020-04-26 LAB — ANTIBODY SCREEN

## 2020-04-26 LAB — AB SCR+ANTIBODY ID: Antibody Screen: POSITIVE — AB

## 2020-04-26 LAB — HEMOGLOBIN A1C
Est. average glucose Bld gHb Est-mCnc: 91 mg/dL
Hgb A1c MFr Bld: 4.8 % (ref 4.8–5.6)

## 2020-04-26 LAB — RPR: RPR Ser Ql: NONREACTIVE

## 2020-04-26 LAB — RH TYPE: Rh Factor: POSITIVE

## 2020-04-26 LAB — HIV ANTIBODY (ROUTINE TESTING W REFLEX): HIV Screen 4th Generation wRfx: NONREACTIVE

## 2020-05-02 ENCOUNTER — Encounter: Payer: Medicaid Other | Admitting: Obstetrics and Gynecology

## 2020-05-04 ENCOUNTER — Encounter: Payer: Self-pay | Admitting: Obstetrics and Gynecology

## 2020-05-04 NOTE — Progress Notes (Signed)
Date Fasting Breakfast Lunch Dinner  9/29 88 108 204 96  9/30 79  71 97  10/1 77 104 108 99  10/2 74 90 92 85  10/3 81 86 91 88  10/4 77 80 98 89  10/5 90 87 92 90

## 2020-05-09 ENCOUNTER — Encounter: Payer: Medicaid Other | Admitting: Obstetrics and Gynecology

## 2020-05-09 ENCOUNTER — Ambulatory Visit (INDEPENDENT_AMBULATORY_CARE_PROVIDER_SITE_OTHER): Payer: Medicaid Other | Admitting: Obstetrics and Gynecology

## 2020-05-09 ENCOUNTER — Other Ambulatory Visit: Payer: Self-pay

## 2020-05-09 DIAGNOSIS — O09529 Supervision of elderly multigravida, unspecified trimester: Secondary | ICD-10-CM

## 2020-05-09 DIAGNOSIS — Z3A30 30 weeks gestation of pregnancy: Secondary | ICD-10-CM

## 2020-05-09 DIAGNOSIS — O0993 Supervision of high risk pregnancy, unspecified, third trimester: Secondary | ICD-10-CM

## 2020-05-09 DIAGNOSIS — O360990 Maternal care for other rhesus isoimmunization, unspecified trimester, not applicable or unspecified: Secondary | ICD-10-CM

## 2020-05-09 NOTE — Progress Notes (Signed)
ROB- no concerns 

## 2020-05-09 NOTE — Progress Notes (Signed)
I connected with Monica Drake on 05/09/20 at  3:50 PM EDT by telephone and verified that I am speaking with the correct person using two identifiers.   I discussed the limitations, risks, security and privacy concerns of performing an evaluation and management service by telephone and the availability of in person appointments. I also discussed with the patient that there may be a patient responsible charge related to this service. The patient expressed understanding and agreed to proceed.  The patient was at home I spoke with the patient from my workstation phoneThe names of people involved in this encounter were: Monica Drake , and Vena Austria  Routine Prenatal Care Visit  Subjective  Monica Drake is a 39 y.o. [id number] at [redacted]w[redacted]d being seen today for ongoing prenatal care.  She is currently monitored for the following issues for this high-risk pregnancy and has High-risk pregnancy; Grade I hemorrhoids; Anti-E isoimmunization affecting pregnancy, antepartum; Anemia of pregnancy; Right renal mass; Ulcerative pancolitis with rectal bleeding (HCC); Iron deficiency anemia; GBS bacteriuria; and Antepartum multigravida of advanced maternal age on their problem list.  ----------------------------------------------------------------------------------- Patient reports no complaints.   Contractions: Irregular. Vag. Bleeding: None.  Movement: Present. Denies leaking of fluid.  ----------------------------------------------------------------------------------- The following portions of the patient's history were reviewed and updated as appropriate: allergies, current medications, past family history, past medical history, past social history, past surgical history and problem list. Problem list updated.   Objective  Last menstrual period 10/09/2019, currently breastfeeding. Pregravid weight 122 lb (55.3 kg) Total Weight Gain 24 lb (10.9 kg) Urinalysis:      Fetal Status:      Movement: Present     No physical exam as this was a remote telephone visit to promote social distancing during the current COVID-19 Pandemic   Assessment   39 y.o. 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  Monitor serial T&S if titer rises >1:16 MCA doppler and DP referral 1:1 titer initial NOB labs      Previous Version   High-risk pregnancy 06/21/2015 by Foraker Bing, MD No   Overview Addendum 04/25/2020  2:45 PM by Vena [place], MD    [facility] to [facility] Prenatal Labs  Dating LMP = 18 week [place] Blood type: O positive  Genetic Screen Declines all genetic screening Antibody:positive Anti-E  Anatomic [place] Complete 02/16/2020 Rubella: Immune Varicella: unknown  GTT Early: NA, Third trimester:  RPR: NR  Rhogam N/A HBsAg: Negative  TDaP vaccine Declines Flu Shot:Declines HIV: negative  Baby Food Breast                               GBS: positive urine  Contraception Declines Pap: 07/15/17 NIL HPV negative  CBB     CS/VBAC NA   Support Person Alaric Minteer             Previous Version  Gestational age appropriate obstetric precautions including but not limited to vaginal bleeding, contractions, leaking of fluid and fetal movement were reviewed in detail with the patient.    1) BG log reviewed and normal  2) Anti-E - remains to weak to titer.  Present during prior pregnancies.   Repeat titer qmonth unless rises  Return in about 2 weeks (around 05/23/2020) for ROB.  Vena Austria, MD, Evern Core [facility], [facility] 05/09/2020, 4:15 PM

## 2020-05-24 ENCOUNTER — Encounter: Payer: Self-pay | Admitting: Obstetrics and Gynecology

## 2020-05-24 ENCOUNTER — Other Ambulatory Visit: Payer: Self-pay

## 2020-05-24 ENCOUNTER — Ambulatory Visit (INDEPENDENT_AMBULATORY_CARE_PROVIDER_SITE_OTHER): Payer: Medicaid Other | Admitting: Obstetrics and Gynecology

## 2020-05-24 DIAGNOSIS — O360990 Maternal care for other rhesus isoimmunization, unspecified trimester, not applicable or unspecified: Secondary | ICD-10-CM

## 2020-05-24 DIAGNOSIS — O0993 Supervision of high risk pregnancy, unspecified, third trimester: Secondary | ICD-10-CM

## 2020-05-24 DIAGNOSIS — O99013 Anemia complicating pregnancy, third trimester: Secondary | ICD-10-CM

## 2020-05-24 DIAGNOSIS — O09529 Supervision of elderly multigravida, unspecified trimester: Secondary | ICD-10-CM

## 2020-05-24 LAB — POCT URINALYSIS DIPSTICK OB
Glucose, UA: NEGATIVE
POC,PROTEIN,UA: NEGATIVE

## 2020-05-24 NOTE — Progress Notes (Signed)
Routine Prenatal Care Visit  Subjective  Monica Drake is a 39 y.o. [id number] at [redacted]w[redacted]d being seen today for ongoing prenatal care.  She is currently monitored for the following issues for this high-risk pregnancy and has High-risk pregnancy; Grade I hemorrhoids; Anti-E isoimmunization affecting pregnancy, antepartum; Anemia of pregnancy; Right renal mass; Ulcerative pancolitis with rectal bleeding (HCC); Iron deficiency anemia; GBS bacteriuria; and Antepartum multigravida of advanced maternal age on their problem list.  ----------------------------------------------------------------------------------- Patient reports no complaints.   Contractions: Irregular. Vag. Bleeding: None.  Movement: Present. Denies leaking of fluid.  ----------------------------------------------------------------------------------- The following portions of the patient's history were reviewed and updated as appropriate: allergies, current medications, past family history, past medical history, past social history, past surgical history and problem list. Problem list updated.   Objective  Blood pressure 100/68, height 5\' 6"  (1.676 m), weight 146 lb 12.8 oz (66.6 kg), last menstrual period 10/09/2019, currently breastfeeding. Pregravid weight 122 lb (55.3 kg) Total Weight Gain 24 lb 12.8 oz (11.2 kg) Urinalysis:      Fetal Status: Fetal Heart Rate (bpm): 140 Fundal Height: 32 cm Movement: Present     General:  Alert, oriented and cooperative. Patient is in no acute distress.  Skin: Skin is warm and dry. No rash noted.   Cardiovascular: Normal heart rate noted  Respiratory: Normal respiratory effort, no problems with respiration noted  Abdomen: Soft, gravid, appropriate for gestational age. Pain/Pressure: Present     Pelvic:  Cervical exam deferred        Extremities: Normal range of motion.     Mental Status: Normal mood and affect. Normal behavior. Normal judgment and thought content.     Assessment    39 y.o. 30 at [redacted]w[redacted]d by  07/15/2020, by Last Menstrual Period presenting for routine prenatal visit  Plan   Pregnancy#9 Problems (from 10/09/19 to present)    Problem Noted Resolved   GBS bacteriuria 04/25/2020 by 04/27/2020, MD No   Antepartum multigravida of advanced maternal age 73/27/2021 by 04/27/2020, MD No   Anti-E isoimmunization affecting pregnancy, antepartum 07/29/2017 by 07/31/2017, MD No   Overview Addendum 04/25/2020  2:45 PM by 04/27/2020, MD    Anti-E antibody on initial T&S Reportedly posistive since her G2 pregnancy, no history of blood transfusions.  Monitor serial T&S if titer rises >1:16 MCA doppler and DP referral 1:1 titer initial NOB labs      Previous Version   High-risk pregnancy 06/21/2015 by 06/23/2015, MD No   Overview Addendum 05/24/2020 11:59 AM by 05/26/2020, MD    [facility] to [facility] Prenatal Labs  Dating LMP = 18 week [facility] Blood type: O positive  Genetic Screen Declines all genetic screening Antibody:positive Anti-E  Anatomic [place] Complete 02/16/2020 Rubella: Immune Varicella: unknown  GTT Early: NA, Third trimester: monitoring normal RPR: NR  Rhogam N/A HBsAg: Negative  TDaP vaccine Declines Flu Shot:Declines HIV: negative  Baby Food Breast                               GBS: positive urine  Contraception Declines Pap: 07/15/17 NIL HPV negative  CBB     CS/VBAC NA   Support Person Alaric Larrabee        High Risk Pregnancy Diagnoses Ulcerative Colitis- diet controlled Anti-E GBS bactiuria History of anemia- improved Advanced maternal age History of precipitous delivery  Previous Version       Discussed birth control postpartum. Patient's husband considering vasectomy. Patient feels like this pregnancy will complete their family. Pregnancy is physically laborious for her and worsens her symptoms of ulcerative colitis.   She is following with her GI  doctor. She is on a strict diet to help with UC. She does report some rectal bleeding, but not as bothersome as her previous pregnancy.   Gestational age appropriate obstetric precautions including but not limited to vaginal bleeding, contractions, leaking of fluid and fetal movement were reviewed in detail with the patient.    Return in about 2 weeks (around 06/07/2020) for ROB in person.  Natale Milch MD [facility], [facility] 05/24/2020, 12:00 PM

## 2020-06-06 ENCOUNTER — Other Ambulatory Visit: Payer: Self-pay

## 2020-06-06 ENCOUNTER — Ambulatory Visit (INDEPENDENT_AMBULATORY_CARE_PROVIDER_SITE_OTHER): Payer: Medicaid Other | Admitting: Obstetrics

## 2020-06-06 VITALS — BP 100/60 | Wt 148.0 lb

## 2020-06-06 DIAGNOSIS — O0993 Supervision of high risk pregnancy, unspecified, third trimester: Secondary | ICD-10-CM

## 2020-06-06 DIAGNOSIS — O26843 Uterine size-date discrepancy, third trimester: Secondary | ICD-10-CM

## 2020-06-06 DIAGNOSIS — Z3A34 34 weeks gestation of pregnancy: Secondary | ICD-10-CM

## 2020-06-06 NOTE — Progress Notes (Signed)
Routine Prenatal Care Visit  Subjective  Monica Drake is a 39 y.o. [id number] at [redacted]w[redacted]d being seen today for ongoing prenatal care.  She is currently monitored for the following issues for this high-risk pregnancy and has High-risk pregnancy; Grade I hemorrhoids; Anti-E isoimmunization affecting pregnancy, antepartum; Anemia of pregnancy; Right renal mass; Ulcerative pancolitis with rectal bleeding (HCC); Iron deficiency anemia; GBS bacteriuria; and Antepartum multigravida of advanced maternal age on their problem list.  ----------------------------------------------------------------------------------- Patient reports no bleeding, no contractions, no cramping, no leaking and but her ulcertive colitis has continued to be "challenging". The baby is active. She follows a strict diet to address the UC..    .  .   Monica Drake denies.  ----------------------------------------------------------------------------------- The following portions of the patient's history were reviewed and updated as appropriate: allergies, current medications, past family history, past medical history, past social history, past surgical history and problem list. Problem list updated.  Objective  Last menstrual period 10/09/2019, currently breastfeeding. Pregravid weight 122 lb (55.3 kg) Total Weight Gain 24 lb 12.8 oz (11.2 kg) Urinalysis: Urine Protein    Urine Glucose    Fetal Status:           General:  Alert, oriented and cooperative. Patient is in no acute distress.  Skin: Skin is warm and dry. No rash noted.   Cardiovascular: Normal heart rate noted  Respiratory: Normal respiratory effort, no problems with respiration noted  Abdomen: Soft, gravid, appropriate for gestational age.       Pelvic:  Cervical exam deferred        Extremities: Normal range of motion.     Mental Status: Normal mood and affect. Normal behavior. Normal judgment and thought content.   Assessment   39 y.o. 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  Monitor serial T&S if titer rises >1:16 MCA doppler and DP referral 1:1 titer initial NOB labs      Previous Version   High-risk pregnancy 06/21/2015 by Ripley Bing, MD No   Overview Addendum 05/24/2020 11:59 AM by Natale Milch, MD    [facility] P[facility]atal Labs  Dating LMP = 18 week [place] Blood type: O positive  Genetic Screen Declines all genetic screening Antibody:positive Anti-E  Anatomic [place] Complete 02/16/2020 Rubella: Immune Varicella: unknown  GTT Early: NA, Third trimester: monitoring normal RPR: NR  Rhogam N/A HBsAg: Negative  TDaP vaccine Declines Flu Shot:Declines HIV: negative  Baby Food Breast                               GBS: positive urine  Contraception Declines Pap: 07/15/17 NIL HPV negative  CBB     CS/VBAC NA   Support Person Monica Drake        High Risk Pregnancy Diagnoses Ulcerative Colitis- diet controlled Anti-E GBS bactiuria History of anemia- improved Advanced maternal age History of  precipitous delivery      Previous Version       Preterm labor symptoms and general obstetric precautions including but not limited to vaginal bleeding, contractions, leaking of Drake and fetal movement were reviewed in detail with the patient. Please refer to After Visit Summary for other counseling recommendations.  Per [place], her baby  is vertex, but she measures small for dates. I have ordered a growth scan for her for her next appointment.  Return in about 2 weeks (around 06/20/2020) for return OB.  Monica Drake, CNM  06/06/2020 2:23 PM

## 2020-06-06 NOTE — Progress Notes (Signed)
No additional concerns.rj

## 2020-06-20 ENCOUNTER — Encounter: Payer: Medicaid Other | Admitting: Obstetrics & Gynecology

## 2020-06-20 ENCOUNTER — Other Ambulatory Visit: Payer: Self-pay

## 2020-06-20 ENCOUNTER — Ambulatory Visit (INDEPENDENT_AMBULATORY_CARE_PROVIDER_SITE_OTHER): Payer: Medicaid Other | Admitting: Obstetrics

## 2020-06-20 ENCOUNTER — Ambulatory Visit (INDEPENDENT_AMBULATORY_CARE_PROVIDER_SITE_OTHER): Payer: Medicaid Other

## 2020-06-20 VITALS — BP 100/50 | Wt 152.0 lb

## 2020-06-20 DIAGNOSIS — Z3A37 37 weeks gestation of pregnancy: Secondary | ICD-10-CM

## 2020-06-20 DIAGNOSIS — O0993 Supervision of high risk pregnancy, unspecified, third trimester: Secondary | ICD-10-CM | POA: Diagnosis not present

## 2020-06-20 DIAGNOSIS — O26843 Uterine size-date discrepancy, third trimester: Secondary | ICD-10-CM

## 2020-06-20 DIAGNOSIS — Z3A36 36 weeks gestation of pregnancy: Secondary | ICD-10-CM

## 2020-06-20 DIAGNOSIS — Z3A34 34 weeks gestation of pregnancy: Secondary | ICD-10-CM

## 2020-06-20 LAB — POCT URINALYSIS DIPSTICK OB
Glucose, UA: NEGATIVE
POC,PROTEIN,UA: NEGATIVE

## 2020-06-20 NOTE — Progress Notes (Signed)
Routine Prenatal Care Visit  Subjective  Monica Drake is a 40 y.o. [id number] at [redacted]w[redacted]d being seen today for ongoing prenatal care.  She is currently monitored for the following issues for this high-risk pregnancy and has High-risk pregnancy; Grade I hemorrhoids; Anti-E isoimmunization affecting pregnancy, antepartum; Anemia of pregnancy; Right renal mass; Ulcerative pancolitis with rectal bleeding (HCC); Iron deficiency anemia; GBS bacteriuria; and Antepartum multigravida of advanced maternal age on their problem list.  ----------------------------------------------------------------------------------- Patient reports difficulty sleeping. Her colitis specialist wants her to be induced- she is also requesting IOL.  .  .   Monica Drake Fluid denies.  ----------------------------------------------------------------------------------- The following portions of the patient's history were reviewed and updated as appropriate: allergies, current medications, past family history, past medical history, past social history, past surgical history and problem list. Problem list updated.  Objective  Blood pressure (!) 100/50, weight 152 lb (68.9 kg), last menstrual period 10/09/2019, currently breastfeeding. Pregravid weight 122 lb (55.3 kg) Total Weight Gain 30 lb (13.6 kg) Urinalysis: Urine Protein Negative  Urine Glucose Negative  Fetal Status:           General:  Alert, oriented and cooperative. Patient is in no acute distress.  Skin: Skin is warm and dry. No rash noted.   Cardiovascular: Normal heart rate noted  Respiratory: Normal respiratory effort, no problems with respiration noted  Abdomen: Soft, gravid, appropriate for gestational age.       Pelvic:  Cervical exam deferred        Extremities: Normal range of motion.     Mental Status: Normal mood and affect. Normal behavior. Normal judgment and thought content.   Assessment   39 y.o. [id number] at [redacted]w[redacted]d by  07/15/2020, by Last Menstrual  Period presenting for routine prenatal visit  Plan   Pregnancy#9 Problems (from 10/09/19 to present)    Problem Noted Resolved   GBS bacteriuria 04/25/2020 by Vena Austria, MD No   Antepartum multigravida of advanced maternal age 56/27/2021 by Vena Austria, MD No   Anti-E isoimmunization affecting pregnancy, antepartum 07/29/2017 by Vena Austria, MD No   Overview Addendum 04/25/2020  2:45 PM by Vena Austria, MD    Anti-E antibody on initial T&S Reportedly posistive since her G2 pregnancy, no history of blood transfusions.  Monitor serial T&S if titer rises >1:16 MCA doppler and DP referral 1:1 titer initial NOB labs      Previous Version   High-risk pregnancy 06/21/2015 by Kendall Bing, MD No   Overview Addendum 05/24/2020 11:59 AM by Natale Milch, MD    [facility] Prenatal Labs  Dating LMP = 18 week [place] Blood type: O positive  Genetic Screen Declines all genetic screening Antibody:positive Anti-E  Anatomic [place] Complete 02/16/2020 Rubella: Immune Varicella: unknown  GTT Early: NA, Third trimester: monitoring normal RPR: NR  Rhogam N/A HBsAg: Negative  TDaP vaccine Declines Flu Shot:Declines HIV: negative  Baby Food Breast                               GBS: positive urine  Contraception Declines Pap: 07/15/17 NIL HPV negative  CBB     CS/VBAC NA   Support Person Alaric Devereux        High Risk Pregnancy Diagnoses Ulcerative Colitis- diet controlled Anti-E GBS bactiuria History of anemia- improved Advanced maternal age History of precipitous delivery      Previous Version  Term labor symptoms and general obstetric precautions including but not limited to vaginal bleeding, contractions, leaking of fluid and fetal movement were reviewed in detail with the patient. Please refer to After Visit Summary for other counseling recommendations.  Suggestions for sleep hygiene provided. Will pass on  the IOL request to Dr. Jerene Pitch who ses her next week. Return in about 1 week (around 06/27/2020) for return OB.  Mirna Mires, CNM  06/20/2020 4:58 PM

## 2020-06-20 NOTE — Progress Notes (Signed)
No concerns. 

## 2020-06-24 ENCOUNTER — Other Ambulatory Visit: Payer: Self-pay

## 2020-06-24 ENCOUNTER — Inpatient Hospital Stay
Admission: EM | Admit: 2020-06-24 | Discharge: 2020-06-25 | DRG: 806 | Disposition: A | Discharge status: Home / Self Care | Payer: Medicaid Other | Attending: Obstetrics and Gynecology | Admitting: Obstetrics and Gynecology

## 2020-06-24 ENCOUNTER — Encounter: Payer: Self-pay | Admitting: Obstetrics and Gynecology

## 2020-06-24 DIAGNOSIS — D649 Anemia, unspecified: Secondary | ICD-10-CM | POA: Diagnosis present

## 2020-06-24 DIAGNOSIS — K51919 Ulcerative colitis, unspecified with unspecified complications: Secondary | ICD-10-CM | POA: Diagnosis not present

## 2020-06-24 DIAGNOSIS — K5190 Ulcerative colitis, unspecified, without complications: Secondary | ICD-10-CM | POA: Diagnosis present

## 2020-06-24 DIAGNOSIS — O9962 Diseases of the digestive system complicating childbirth: Principal | ICD-10-CM

## 2020-06-24 DIAGNOSIS — O0993 Supervision of high risk pregnancy, unspecified, third trimester: Secondary | ICD-10-CM

## 2020-06-24 DIAGNOSIS — Z20822 Contact with and (suspected) exposure to covid-19: Secondary | ICD-10-CM | POA: Diagnosis present

## 2020-06-24 DIAGNOSIS — O360990 Maternal care for other rhesus isoimmunization, unspecified trimester, not applicable or unspecified: Secondary | ICD-10-CM

## 2020-06-24 DIAGNOSIS — O9902 Anemia complicating childbirth: Secondary | ICD-10-CM | POA: Diagnosis present

## 2020-06-24 DIAGNOSIS — Z9114 Patient's other noncompliance with medication regimen: Secondary | ICD-10-CM

## 2020-06-24 DIAGNOSIS — Z3A37 37 weeks gestation of pregnancy: Secondary | ICD-10-CM

## 2020-06-24 DIAGNOSIS — O99824 Streptococcus B carrier state complicating childbirth: Secondary | ICD-10-CM | POA: Diagnosis present

## 2020-06-24 DIAGNOSIS — O09529 Supervision of elderly multigravida, unspecified trimester: Secondary | ICD-10-CM

## 2020-06-24 DIAGNOSIS — R8271 Bacteriuria: Secondary | ICD-10-CM

## 2020-06-24 LAB — RESP PANEL BY RT-PCR (FLU A&B, COVID) ARPGX2
Influenza A by PCR: NEGATIVE
Influenza B by PCR: NEGATIVE
SARS Coronavirus 2 by RT PCR: NEGATIVE

## 2020-06-24 LAB — CBC
HCT: 33.2 % — ABNORMAL LOW (ref 36.0–46.0)
Hemoglobin: 10.9 g/dL — ABNORMAL LOW (ref 12.0–15.0)
MCH: 30.4 pg (ref 26.0–34.0)
MCHC: 32.8 g/dL (ref 30.0–36.0)
MCV: 92.5 fL (ref 80.0–100.0)
Platelets: 249 10*3/uL (ref 150–400)
RBC: 3.59 MIL/uL — ABNORMAL LOW (ref 3.87–5.11)
RDW: 12.7 % (ref 11.5–15.5)
WBC: 9.2 10*3/uL (ref 4.0–10.5)
nRBC: 0 % (ref 0.0–0.2)

## 2020-06-24 LAB — RAPID HIV SCREEN (HIV 1/2 AB+AG)
HIV 1/2 Antibodies: NONREACTIVE
HIV-1 P24 Antigen - HIV24: NONREACTIVE

## 2020-06-24 LAB — GROUP B STREP BY PCR: Group B strep by PCR: POSITIVE — AB

## 2020-06-24 MED ORDER — PREDNISONE 5 MG PO TABS: 5.0000 mg | ORAL_TABLET | Freq: Every day | ORAL | Status: DC

## 2020-06-24 MED ORDER — DIPHENHYDRAMINE HCL 25 MG PO CAPS: 25.0000 mg | ORAL_CAPSULE | Freq: Four times a day (QID) | ORAL | Status: DC | PRN

## 2020-06-24 MED ORDER — PREDNISONE 10 MG PO TABS: 20.0000 mg | ORAL_TABLET | Freq: Every day | ORAL | Status: DC

## 2020-06-24 MED ORDER — DIBUCAINE (PERIANAL) 1 % EX OINT: 1.0000 "application " | TOPICAL_OINTMENT | CUTANEOUS | Status: DC | PRN

## 2020-06-24 MED ORDER — PENICILLIN G POT IN DEXTROSE 60000 UNIT/ML IV SOLN
3.0000 10*6.[IU] | INTRAVENOUS | Status: DC
  Administered 2020-06-24: 3 10*6.[IU] via INTRAVENOUS
  Filled 2020-06-24 (×5): qty 50

## 2020-06-24 MED ORDER — ONDANSETRON HCL 4 MG PO TABS: 4.0000 mg | ORAL_TABLET | ORAL | Status: DC | PRN

## 2020-06-24 MED ORDER — BUTORPHANOL TARTRATE 2 MG/ML IJ SOLN: 2.0000 mg | INTRAMUSCULAR | Status: DC | PRN

## 2020-06-24 MED ORDER — BENZOCAINE-MENTHOL 20-0.5 % EX AERO
1.0000 "application " | INHALATION_SPRAY | CUTANEOUS | Status: DC | PRN
  Administered 2020-06-24: 1 via TOPICAL
  Filled 2020-06-24: qty 56

## 2020-06-24 MED ORDER — ACETAMINOPHEN 500 MG PO TABS
1000.0000 mg | ORAL_TABLET | Freq: Four times a day (QID) | ORAL | Status: DC
  Administered 2020-06-24 – 2020-06-25 (×3): 1000 mg via ORAL
  Filled 2020-06-24 (×3): qty 2

## 2020-06-24 MED ORDER — OXYTOCIN BOLUS FROM INFUSION
333.0000 mL | Freq: Once | INTRAVENOUS | Status: AC
  Administered 2020-06-24: 333 mL via INTRAVENOUS

## 2020-06-24 MED ORDER — DOCUSATE SODIUM 100 MG PO CAPS
100.0000 mg | ORAL_CAPSULE | Freq: Two times a day (BID) | ORAL | Status: DC
  Administered 2020-06-25: 100 mg via ORAL
  Filled 2020-06-24: qty 1

## 2020-06-24 MED ORDER — LIDOCAINE HCL (PF) 1 % IJ SOLN
INTRAMUSCULAR | Status: AC
  Filled 2020-06-24: qty 30

## 2020-06-24 MED ORDER — COCONUT OIL OIL
1.0000 "application " | TOPICAL_OIL | Status: DC | PRN
  Filled 2020-06-24: qty 120

## 2020-06-24 MED ORDER — ACETAMINOPHEN 325 MG PO TABS: 650.0000 mg | ORAL_TABLET | ORAL | Status: DC | PRN

## 2020-06-24 MED ORDER — LACTATED RINGERS IV SOLN
INTRAVENOUS | Status: DC
  Administered 2020-06-24: 125 mL via INTRAVENOUS

## 2020-06-24 MED ORDER — SIMETHICONE 80 MG PO CHEW: 80.0000 mg | CHEWABLE_TABLET | ORAL | Status: DC | PRN

## 2020-06-24 MED ORDER — MISOPROSTOL 25 MCG QUARTER TABLET
25.0000 ug | ORAL_TABLET | ORAL | Status: DC | PRN
  Administered 2020-06-24: 25 ug via VAGINAL
  Filled 2020-06-24 (×2): qty 1

## 2020-06-24 MED ORDER — PRENATAL MULTIVITAMIN CH: 1.0000 | ORAL_TABLET | Freq: Every day | ORAL | Status: DC

## 2020-06-24 MED ORDER — OXYTOCIN-SODIUM CHLORIDE 30-0.9 UT/500ML-% IV SOLN: 1.0000 m[IU]/min | INTRAVENOUS | Status: DC

## 2020-06-24 MED ORDER — TERBUTALINE SULFATE 1 MG/ML IJ SOLN: 0.2500 mg | Freq: Once | INTRAMUSCULAR | Status: DC | PRN

## 2020-06-24 MED ORDER — ONDANSETRON HCL 4 MG/2ML IJ SOLN: 4.0000 mg | INTRAMUSCULAR | Status: DC | PRN

## 2020-06-24 MED ORDER — PREDNISONE 10 MG PO TABS: 10.0000 mg | ORAL_TABLET | ORAL | Status: DC

## 2020-06-24 MED ORDER — PREDNISONE 10 MG PO TABS
30.0000 mg | ORAL_TABLET | Freq: Every day | ORAL | Status: DC
  Filled 2020-06-24 (×2): qty 3

## 2020-06-24 MED ORDER — OXYTOCIN 10 UNIT/ML IJ SOLN
INTRAMUSCULAR | Status: AC
  Filled 2020-06-24: qty 2

## 2020-06-24 MED ORDER — LIDOCAINE HCL (PF) 1 % IJ SOLN
30.0000 mL | INTRAMUSCULAR | Status: AC | PRN
  Administered 2020-06-24: 30 mL via SUBCUTANEOUS

## 2020-06-24 MED ORDER — OXYTOCIN-SODIUM CHLORIDE 30-0.9 UT/500ML-% IV SOLN
2.5000 [IU]/h | INTRAVENOUS | Status: DC
  Administered 2020-06-24: 2.5 [IU]/h via INTRAVENOUS

## 2020-06-24 MED ORDER — SODIUM CHLORIDE 0.9 % IV SOLN
5.0000 10*6.[IU] | Freq: Once | INTRAVENOUS | Status: AC
  Administered 2020-06-24: 5 10*6.[IU] via INTRAVENOUS
  Filled 2020-06-24: qty 5

## 2020-06-24 MED ORDER — LACTATED RINGERS IV SOLN: 500.0000 mL | INTRAVENOUS | Status: DC | PRN

## 2020-06-24 MED ORDER — AMMONIA AROMATIC IN INHA
RESPIRATORY_TRACT | Status: AC
  Filled 2020-06-24: qty 10

## 2020-06-24 MED ORDER — ONDANSETRON HCL 4 MG/2ML IJ SOLN: 4.0000 mg | Freq: Four times a day (QID) | INTRAMUSCULAR | Status: DC | PRN

## 2020-06-24 MED ORDER — OXYTOCIN-SODIUM CHLORIDE 30-0.9 UT/500ML-% IV SOLN
INTRAVENOUS | Status: AC
  Filled 2020-06-24: qty 1000

## 2020-06-24 MED ORDER — PREDNISONE 5 MG PO TABS
15.0000 mg | ORAL_TABLET | Freq: Every day | ORAL | Status: DC
  Filled 2020-06-24: qty 1

## 2020-06-24 MED ORDER — ZOLPIDEM TARTRATE 5 MG PO TABS: 5.0000 mg | ORAL_TABLET | Freq: Every evening | ORAL | Status: DC | PRN

## 2020-06-24 MED ORDER — SOD CITRATE-CITRIC ACID 500-334 MG/5ML PO SOLN: 30.0000 mL | ORAL | Status: DC | PRN

## 2020-06-24 MED ORDER — PREDNISONE 10 MG PO TABS: 10.0000 mg | ORAL_TABLET | Freq: Every day | ORAL | Status: DC

## 2020-06-24 MED ORDER — IBUPROFEN 600 MG PO TABS: 600.0000 mg | ORAL_TABLET | Freq: Four times a day (QID) | ORAL | Status: DC

## 2020-06-24 MED ORDER — MISOPROSTOL 200 MCG PO TABS
ORAL_TABLET | ORAL | Status: AC
  Filled 2020-06-24: qty 4

## 2020-06-24 MED ORDER — WITCH HAZEL-GLYCERIN EX PADS
1.0000 "application " | MEDICATED_PAD | CUTANEOUS | Status: DC | PRN
  Administered 2020-06-25: 1 via TOPICAL

## 2020-06-24 NOTE — Progress Notes (Signed)
   06/24/20 0940  Clinical Encounter Type  Visited With Patient and family together  Visit Type Initial  Referral From Nurse  Consult/Referral To Chaplain  Chaplain responded to an OR. When she arrived at the room, father was sitting at near the window and Pt was hooked to the monitor. Chaplain introduced herself and educated them on the AD. Chaplain explain after AD is filled out they will near an notary and two witnesses, but may not be able to get witnesses today. Chaplain explained they can get the form notarized at the bank and the were fine with that. Chaplain told them after form is completed to make sure the hospital gets a copy.

## 2020-06-24 NOTE — Discharge Summary (Signed)
Postpartum Discharge Summary  Date of Service updated 06/25/2020     Patient Name: Monica Drake DOB: 1981/03/11 MRN: [id number]  Date of admission: 06/24/2020 Delivery date:06/24/2020  Delivering provider: Adrian Prows R  Date of discharge: 06/25/2020  Admitting diagnosis: [redacted] weeks gestation of pregnancy [Z3A.37] Intrauterine pregnancy: [redacted]w[redacted]d    Secondary diagnosis:  Active Problems:   [redacted] weeks gestation of pregnancy   Postpartum care following vaginal delivery   Normal labor and delivery Advanced maternal age Anti-E isoimmunization GBS bacteriuria Ulcerative Colitiis Anemia in pregnancy  Additional problems: none    Discharge diagnosis: Term Pregnancy Delivered                                              Post partum procedures:none Augmentation: AROM and Cytotec Complications: None  Hospital course: Induction of Labor With Vaginal Delivery   39y.o. yo [id number] admitted to the hospital 06/24/2020 for induction of labor.  Indication for induction: UNCONTROLLED ULCERATIVE COLITIS.  Patient had an uncomplicated labor course as follows: Membrane Rupture Time/Date: 5:40 PM ,06/24/2020   Delivery Method:Vaginal, Spontaneous  Episiotomy: None  Lacerations:  1st degree;Periurethral  Details of delivery can be found in separate delivery note.  Patient had a routine postpartum course. Patient is discharged home 06/25/20.  Newborn Data: Birth date:06/24/2020  Birth time:7:31 PM  Gender:Female  Living status:Living  Apgars:8 ,9  Weight:2930 g   Magnesium Sulfate received: No BMZ received: No Rhophylac:No MMR:No T-DaP:DELINES Flu: No Transfusion:No  Physical exam  Vitals:   06/24/20 2220 06/24/20 2338 06/25/20 0449 06/25/20 0811  BP: (!) 100/55 106/65 129/85 111/84  Pulse: 64 66 70 63  Resp: '16 16 16 18  ' Temp: 98 F (36.7 C) 98.2 F (36.8 C) 98.3 F (36.8 C) 98.1 F (36.7 C)  TempSrc: Oral Oral Oral Oral  SpO2: 99% 98% 100% 99%   Weight:      Height:       General: alert, cooperative and no distress Lochia: appropriate Uterine Fundus: firm Incision: N/A DVT Evaluation: No evidence of DVT seen on physical exam. Negative Homan's sign. No cords or calf tenderness. Labs: Lab Results  Component Value Date   WBC 12.6 (H) 06/25/2020   HGB 10.1 (L) 06/25/2020   HCT 31.2 (L) 06/25/2020   MCV 92.6 06/25/2020   PLT 225 06/25/2020   CMP Latest Ref Rng & Units 11/05/2017  Glucose 65 - 99 mg/dL 72  BUN 6 - 20 mg/dL 7  Creatinine 0.57 - 1.00 mg/dL 0.51(L)  Sodium 134 - 144 mmol/L 138  Potassium 3.5 - 5.2 mmol/L 4.1  Chloride 96 - 106 mmol/L 104  CO2 20 - 29 mmol/L 21  Calcium 8.7 - 10.2 mg/dL 8.7  Total Protein 6.0 - 8.5 g/dL 5.8(L)  Total Bilirubin 0.0 - 1.2 mg/dL <0.2  Alkaline Phos 39 - 117 IU/L 107  AST 0 - 40 IU/L 19  ALT 0 - 32 IU/L 12   Edinburgh Score: Edinburgh Postnatal Depression Scale Screening Tool 06/25/2020  I have been able to laugh and see the funny side of things. (No Data)  I have looked forward with enjoyment to things. -  I have blamed myself unnecessarily when things went wrong. -  I have been anxious or worried for no good reason. -  I have felt scared or panicky for no good  reason. -  Things have been getting on top of me. -  I have been so unhappy that I have had difficulty sleeping. -  I have felt sad or miserable. -  I have been so unhappy that I have been crying. -  The thought of harming myself has occurred to me. Flavia Shipper Postnatal Depression Scale Total -      After visit meds:  Allergies as of 06/25/2020   No Known Allergies     Medication List    TAKE these medications   acetaminophen 325 MG tablet Commonly known as: Tylenol Take 2 tablets (650 mg total) by mouth every 4 (four) hours as needed for mild pain or moderate pain (for pain scale < 4).   Cholecalciferol 250 MCG (10000 UT) Caps Take by mouth.   ferrous sulfate 325 (65 FE) MG EC tablet Take 1  tablet (325 mg total) by mouth 2 (two) times daily with a meal.   mesalamine 4 g enema Commonly known as: ROWASA Place rectally.   predniSONE 10 MG tablet Commonly known as: DELTASONE Take 1 tablet by mouth as directed. 67m x 7days, 324mx7days, 2049m 7days then decrease by 5mg80mery 5days   prenatal vitamin w/FE, FA 29-1 MG Chew chewable tablet Chew 1 tablet by mouth daily at 12 noon.        Discharge home in stable condition Infant Feeding: Breast Infant Disposition:home with mother Discharge instruction: per After Visit Summary and Postpartum booklet. Activity: Advance as tolerated. Pelvic rest for 6 weeks.  Diet: routine diet Anticipated Birth Control: VASECTOMY  Being considered Postpartum Appointment:2 weeks Additional Postpartum F/U: Postpartum Depression checkup Future Appointments: Future Appointments  Date Time Provider [place]/12/2019  2:50 PM FryeImagene RichesM [facility] None   Follow up Visit:  Follow-up Information    Schuman, ChriStefanie Libel Follow up in 2 week(s).   Specialty: Obstetrics and Gynecology Contact information: [phone]               06/25/2020 MargImagene RichesM

## 2020-06-24 NOTE — H&P (Signed)
H&P  Monica Drake is an 39 y.o. female.  HPI: Patient presents today to labor and delivery for induction.   Patient is a 39 year old G9 P5-0-3-5 at 37 weeks 0 days gestational age.  She has had a pregnancy complicated by ulcerative colitis.  Her ulcerative colitis has been uncontrolled especially as of recently.  The patient's GI physician sent a note to my office regarding the challenges of the patient's ulcerative colitis.  The patient has been noncompliant with medications because of personal beliefs and largely trying to manage on diet alone.  She has had increased frequency of bloody and mucousy stools.  The patient reports that both her her for herself and her family are worried about the amount of blood and mucus that she loses during bowel movements.  She has oppositions to medications and for this which reason has been resistant to taking any steroids or other medications for her ulcerative colitis management.  Her ulcerative colitis is generally exacerbated by pregnancy and outside of pregnancy her symptoms are controlled by diet.  Recently there was concern from her GI physician regarding possibility of a megacolon after the patient had prolonged constipation.  The patient refused a abdominal MRI.   I spoke with Dr. Parke Poisson from maternal-fetal medicine regarding this patient's care.  He felt that the patient should be delivered at 37 weeks so that the mother's medical condition could be addressed outside of pregnancy.   Pregnancy#9 Problems (from 10/09/19 to present)    Problem Noted Resolved   GBS bacteriuria 04/25/2020 by Vena Austria, MD No   Antepartum multigravida of advanced maternal age 41/27/2021 by Vena Austria, MD No   Anti-E isoimmunization affecting pregnancy, antepartum 07/29/2017 by Vena Austria, MD No   Overview Addendum 04/25/2020  2:45 PM by Vena Austria, MD    Anti-E antibody on initial T&S Reportedly posistive since her G2 pregnancy, no history of  blood transfusions.  Monitor serial T&S if titer rises >1:16 MCA doppler and DP referral 1:1 titer initial NOB labs      Previous Version   High-risk pregnancy 06/21/2015 by Powers Bing, MD No   Overview Addendum 05/24/2020 11:59 AM by Natale Milch, MD    [facility] Prenatal Labs  Dating LMP = 18 week [place] Blood type: O positive  Genetic Screen Declines all genetic screening Antibody:positive Anti-E  Anatomic [place] Complete 02/16/2020 Rubella: Immune Varicella: unknown  GTT Early: NA, Third trimester: monitoring normal RPR: NR  Rhogam N/A HBsAg: Negative  TDaP vaccine Declines Flu Shot:Declines HIV: negative  Baby Food Breast                               GBS: positive urine  Contraception Declines Pap: 07/15/17 NIL HPV negative  CBB     CS/VBAC NA   Support Person Alaric Desanctis        High Risk Pregnancy Diagnoses Ulcerative Colitis- diet controlled Anti-E GBS bactiuria History of anemia- improved Advanced maternal age History of precipitous delivery      Previous Version          Past Medical History:  Diagnosis Date  . Foot fracture, right   . Hypoglycemia   . Iron deficiency anemia 11/21/2017  . Pregnant and not yet delivered in third trimester   . Ulcerative colitis (HCC) 11/11/2017    Past Surgical History:  Procedure Laterality Date  . ECTOPIC PREGNANCY SURGERY  02/27/2005  . FLEXIBLE SIGMOIDOSCOPY N/A 11/11/2017   Procedure: FLEXIBLE SIGMOIDOSCOPY;  Surgeon: [name], MD;  Location[facility] ENDOSCOPY;  Service: Gastroenterology;  Laterality: N/A;  Please use tap water enema prior to procedure. Note: Patient is hypoglycemic  . FLEXIBLE SIGMOIDOSCOPY N/A 12/30/2017   Procedure: FLEXIBLE SIGMOIDOSCOPY;  Surgeon: [name], MD;  Location[facility] END[facility]COPY;  Service: Gastroenterology;  Laterality: N/A;  . HERNIA REPAIR      Family History  Problem Relation Age of Onset  . Diabetes Mother   .  Hypertension Mother   . Arthritis Mother   . Heart disease Mother   . Hypertension Father   . Arthritis Father   . Birth defects Sister     Social History:  reports that she has never smoked. She has never used smokeless tobacco. She reports that she does not drink alcohol and does not use drugs.  Allergies: No Known Allergies  Medications: I have reviewed the patient's current medications.  No results found for this or any previous visit (from the past 48 hour(s)).  No results found.  Review of Systems  Constitutional: Negative for chills and fever.  HENT: Negative for congestion, hearing loss and sinus pain.   Respiratory: Negative for cough, shortness of breath and wheezing.   Cardiovascular: Negative for chest pain, palpitations and leg swelling.  Gastrointestinal: Positive for constipation and diarrhea. Negative for abdominal pain, nausea and vomiting.  Genitourinary: Negative for dysuria, flank pain, frequency, hematuria and urgency.  Musculoskeletal: Negative for back pain.  Skin: Negative for rash.  Neurological: Negative for dizziness and headaches.  Psychiatric/Behavioral: Negative for suicidal ideas. The patient is not nervous/anxious.    Height 5\' 6"  (1.676 m), weight 68.9 kg, last menstrual period 10/09/2019, currently breastfeeding. Physical Exam Vitals and nursing note reviewed.  Constitutional:      Appearance: She is well-developed.  HENT:     Head: Normocephalic and atraumatic.  Cardiovascular:     Rate and Rhythm: Normal rate and regular rhythm.  Pulmonary:     Effort: Pulmonary effort is normal.     Breath sounds: Normal breath sounds.  Abdominal:     General: Bowel sounds are normal.     Palpations: Abdomen is soft.  Musculoskeletal:        General: Normal range of motion.  Skin:    General: Skin is warm and dry.  Neurological:     Mental Status: She is alert and oriented to person, place, and time.  Psychiatric:        Behavior: Behavior  normal.        Thought Content: Thought content normal.        Judgment: Judgment normal.   .crsnes  Assessment/Plan: 39 yo 30 [redacted]w[redacted]d  Will proceed with Induction so that mother's ulcerative colitis can be addressed and treated to maximum potential after delivery. Phone consultation with MFM on 06/22/2020.  Patient has been counseled regarding the risks of an early term delivery including infant respiratory distress and possible NICU admission  Will start with cytotec GBS positive- will treat CBC, HIV, RPR labs today Continue home medications and prednisone taper  All questions answered.    Baden Betsch R Seraphina Mitchner 06/24/2020, 9:02 AM

## 2020-06-25 LAB — CBC
HCT: 31.2 % — ABNORMAL LOW (ref 36.0–46.0)
Hemoglobin: 10.1 g/dL — ABNORMAL LOW (ref 12.0–15.0)
MCH: 30 pg (ref 26.0–34.0)
MCHC: 32.4 g/dL (ref 30.0–36.0)
MCV: 92.6 fL (ref 80.0–100.0)
Platelets: 225 10*3/uL (ref 150–400)
RBC: 3.37 MIL/uL — ABNORMAL LOW (ref 3.87–5.11)
RDW: 12.8 % (ref 11.5–15.5)
WBC: 12.6 10*3/uL — ABNORMAL HIGH (ref 4.0–10.5)
nRBC: 0 % (ref 0.0–0.2)

## 2020-06-25 LAB — RPR: RPR Ser Ql: NONREACTIVE

## 2020-06-25 NOTE — Discharge Instructions (Signed)
Postpartum Care After Vaginal Delivery This sheet gives you information about how to care for yourself from the time you deliver your baby to up to 6-12 weeks after delivery (postpartum period). Your health care provider may also give you more specific instructions. If you have problems or questions, contact your health care provider. Follow these instructions at home: Vaginal bleeding  It is normal to have vaginal bleeding (lochia) after delivery. Wear a sanitary pad for vaginal bleeding and discharge. ? During the first week after delivery, the amount and appearance of lochia is often similar to a menstrual period. ? Over the next few weeks, it will gradually decrease to a dry, yellow-brown discharge. ? For most women, lochia stops completely by 4-6 weeks after delivery. Vaginal bleeding can vary from woman to woman.  Change your sanitary pads frequently. Watch for any changes in your flow, such as: ? A sudden increase in volume. ? A change in color. ? Large blood clots.  If you pass a blood clot from your vagina, save it and call your health care provider to discuss. Do not flush blood clots down the toilet before talking with your health care provider.  Do not use tampons or douches until your health care provider says this is safe.  If you are not breastfeeding, your period should return 6-8 weeks after delivery. If you are feeding your child breast milk only (exclusive breastfeeding), your period may not return until you stop breastfeeding. Perineal care  Keep the area between the vagina and the anus (perineum) clean and dry as told by your health care provider. Use medicated pads and pain-relieving sprays and creams as directed.  If you had a cut in the perineum (episiotomy) or a tear in the vagina, check the area for signs of infection until you are healed. Check for: ? More redness, swelling, or pain. ? Fluid or blood coming from the cut or tear. ? Warmth. ? Pus or a bad  smell.  You may be given a squirt bottle to use instead of wiping to clean the perineum area after you go to the bathroom. As you start healing, you may use the squirt bottle before wiping yourself. Make sure to wipe gently.  To relieve pain caused by an episiotomy, a tear in the vagina, or swollen veins in the anus (hemorrhoids), try taking a warm sitz bath 2-3 times a day. A sitz bath is a warm water bath that is taken while you are sitting down. The water should only come up to your hips and should cover your buttocks. Breast care  Within the first few days after delivery, your breasts may feel heavy, full, and uncomfortable (breast engorgement). Milk may also leak from your breasts. Your health care provider can suggest ways to help relieve the discomfort. Breast engorgement should go away within a few days.  If you are breastfeeding: ? Wear a bra that supports your breasts and fits you well. ? Keep your nipples clean and dry. Apply creams and ointments as told by your health care provider. ? You may need to use breast pads to absorb milk that leaks from your breasts. ? You may have uterine contractions every time you breastfeed for up to several weeks after delivery. Uterine contractions help your uterus return to its normal size. ? If you have any problems with breastfeeding, work with your health care provider or lactation consultant.  If you are not breastfeeding: ? Avoid touching your breasts a lot. Doing this can make   your breasts produce more milk. ? Wear a good-fitting bra and use cold packs to help with swelling. ? Do not squeeze out (express) milk. This causes you to make more milk. Intimacy and sexuality  Ask your health care provider when you can engage in sexual activity. This may depend on: ? Your risk of infection. ? How fast you are healing. ? Your comfort and desire to engage in sexual activity.  You are able to get pregnant after delivery, even if you have not had  your period. If desired, talk with your health care provider about methods of birth control (contraception). Medicines  Take over-the-counter and prescription medicines only as told by your health care provider.  If you were prescribed an antibiotic medicine, take it as told by your health care provider. Do not stop taking the antibiotic even if you start to feel better. Activity  Gradually return to your normal activities as told by your health care provider. Ask your health care provider what activities are safe for you.  Rest as much as possible. Try to rest or take a nap while your baby is sleeping. Eating and drinking   Drink enough fluid to keep your urine pale yellow.  Eat high-fiber foods every day. These may help prevent or relieve constipation. High-fiber foods include: ? Whole grain cereals and breads. ? Brown rice. ? Beans. ? Fresh fruits and vegetables.  Do not try to lose weight quickly by cutting back on calories.  Take your prenatal vitamins until your postpartum checkup or until your health care provider tells you it is okay to stop. Lifestyle  Do not use any products that contain nicotine or tobacco, such as cigarettes and e-cigarettes. If you need help quitting, ask your health care provider.  Do not drink alcohol, especially if you are breastfeeding. General instructions  Keep all follow-up visits for you and your baby as told by your health care provider. Most women visit their health care provider for a postpartum checkup within the first 3-6 weeks after delivery. Contact a health care provider if:  You feel unable to cope with the changes that your child brings to your life, and these feelings do not go away.  You feel unusually sad or worried.  Your breasts become red, painful, or hard.  You have a fever.  You have trouble holding urine or keeping urine from leaking.  You have little or no interest in activities you used to enjoy.  You have not  breastfed at all and you have not had a menstrual period for 12 weeks after delivery.  You have stopped breastfeeding and you have not had a menstrual period for 12 weeks after you stopped breastfeeding.  You have questions about caring for yourself or your baby.  You pass a blood clot from your vagina. Get help right away if:  You have chest pain.  You have difficulty breathing.  You have sudden, severe leg pain.  You have severe pain or cramping in your lower abdomen.  You bleed from your vagina so much that you fill more than one sanitary pad in one hour. Bleeding should not be heavier than your heaviest period.  You develop a severe headache.  You faint.  You have blurred vision or spots in your vision.  You have bad-smelling vaginal discharge.  You have thoughts about hurting yourself or your baby. If you ever feel like you may hurt yourself or others, or have thoughts about taking your own life, get help   help right away. You can go to the nearest emergency department or call:  Your local emergency services (911 in the [place]  A suicide crisis helpline, such as the National Suicide Prevention Lifeline at 731-198-1713. This is open 24 hours a day. Summary  The period of time right after you deliver your newborn up to 6-12 weeks after delivery is called the postpartum period.  Gradually return to your normal activities as told by your health care provider.  Keep all follow-up visits for you and your baby as told by your health care provider. This information is not intended to replace advice given to you by your health care provider. Make sure you discuss any questions you have with your health care provider. Document Revised: 07/19/2017 Document Reviewed: 04/29/2017 Elsevier Patient Education  2020 [employer]. Discharge Instructions:   Follow-up Appointment: 1 week, please call the office to schedule  If there are any new medications, they have been ordered and will  be available for pickup at the listed pharmacy on your way home from the hospital.   Call the office if you have any of the following: headache, visual changes, fever >101.0 F, chills, shortness of breath, breast concerns, excessive vaginal bleeding, incision drainage or problems, leg pain or redness, depression or any other concerns. If you have vaginal discharge with an odor, let your doctor know.   It is normal to bleed for up to 6 weeks. You should not soak through more than 1 pad in 1 hour. If you have a blood clot larger than your fist with continued bleeding, call your doctor.   Activity: Do not lift > 15 lbs for 6 weeks (do not lift anything heavier than your baby). No intercourse, tampons, swimming pools, hot tubs, baths (only showers) for 6 weeks.  No driving for 1-2 weeks. Do not drive while taking narcotic or opioid pain medication.  Continue taking your prenatal vitamin, especially if breastfeeding. Increase calories and fluids (water) while breastfeeding.   Your milk will come in, in the next couple of days (right now it is colostrum). You may have a slight fever when your milk comes in, but it should go away on its own.  If it does not, and rises above 101 F please call the doctor. You will also feel achy and your breasts will be firm. They will also start to leak. If you are breastfeeding, continue as you have been and you can pump/express milk for comfort.   If you have too much milk, your breasts can become engorged, which could lead to mastitis. This is an infection of the milk ducts. It can be very painful and you will need to notify your doctor to obtain a prescription for antibiotics. You can also treat it with a shower or hot/cold compress.   For concerns about your baby, please call your pediatrician.  For breastfeeding concerns, the lactation consultant can be reached at [phone].   Postpartum blues (feelings of happy one minute and sad another minute) are normal for  the first few weeks but if it gets worse let your doctor know.   Congratulations! We enjoyed caring for you and your new bundle of joy!

## 2020-06-26 LAB — BPAM RBC
Blood Product Expiration Date: 202112302359
Blood Product Expiration Date: 202201012359
Unit Type and Rh: 5100
Unit Type and Rh: 5100

## 2020-06-26 LAB — TYPE AND SCREEN
ABO/RH(D): O POS
Antibody Screen: POSITIVE
Antibody Titer (2): 1:2 {titer}
Unit division: 0
Unit division: 0

## 2020-06-26 NOTE — Progress Notes (Signed)
Verbal and written discharge instructions given to [facility] and she verbalized understanding. She was discharged to her vehicle via wheelchair with her husband and tech at her side. She denies any questions or concerns. She left in stable condition.

## 2020-06-27 ENCOUNTER — Encounter: Payer: Medicaid Other | Admitting: Obstetrics and Gynecology

## 2020-07-04 ENCOUNTER — Encounter: Payer: Medicaid Other | Admitting: Obstetrics

## 2020-08-08 ENCOUNTER — Ambulatory Visit (INDEPENDENT_AMBULATORY_CARE_PROVIDER_SITE_OTHER): Payer: Medicaid Other | Admitting: Obstetrics and Gynecology

## 2020-08-08 ENCOUNTER — Other Ambulatory Visit: Payer: Self-pay

## 2020-08-08 ENCOUNTER — Encounter: Payer: Self-pay | Admitting: Obstetrics and Gynecology

## 2020-08-08 ENCOUNTER — Ambulatory Visit: Payer: Medicaid Other | Admitting: Obstetrics and Gynecology

## 2020-08-08 NOTE — Progress Notes (Signed)
  OBSTETRICS POSTPARTUM CLINIC PROGRESS NOTE  Subjective:     Monica Drake is a 40 y.o. [phone] female who presents for a postpartum visit. She is 6 weeks postpartum following a Term pregnancy and delivery by Vaginal, no problems at delivery.  I have fully reviewed the prenatal and intrapartum course. Anesthesia: epidural.  Postpartum course has been complicated by uncomplicated.  Baby is feeding by Breast.  Bleeding: patient has not  resumed menses.  Bowel function is normal. Bladder function is normal.  Patient is not sexually active. Contraception method desired is vasectomy.  Postpartum depression screening: negative. [place] 8.  The following portions of the patient's history were reviewed and updated as appropriate: allergies, current medications, past family history, past medical history, past social history, past surgical history and problem list.  Review of Systems Pertinent items are noted in HPI.  Objective:    BP 116/70   Ht 5\' 6"  (1.676 m)   Wt 142 lb 6.4 oz (64.6 kg)   LMP 10/09/2019   Breastfeeding Yes   BMI 22.98 kg/m   General:  alert and no distress   Breasts:  inspection negative, no nipple discharge or bleeding, no masses or nodularity palpable  Lungs: clear to auscultation bilaterally  Heart:  regular rate and rhythm, S1, S2 normal, no murmur, click, rub or gallop  Abdomen: soft, non-tender; bowel sounds normal; no masses,  no organomegaly.     Vulva:  normal  Vagina: normal vagina, no discharge, exudate, lesion, or erythema  Cervix:  no cervical motion tenderness and no lesions  Corpus: normal size, contour, position, consistency, mobility, non-tender  Adnexa:  normal adnexa and no mass, fullness, tenderness  Rectal Exam: Not performed.          Assessment:  Post Partum Care visit  Plan:  See orders and Patient Instructions Follow up in: 12 months or as needed.   12/09/2019 MD, [facility], [facility] 08/08/2020 3:01 PM

## 2020-08-08 NOTE — Patient Instructions (Signed)
Postpartum Baby Blues The postpartum period begins right after the birth of a baby. During this time, there is often joy and excitement. It is also a time of many changes in the life of the parents. A mother may feel happy one minute and sad or stressed the next. These feelings of sadness, called the baby blues, usually happen in the period right after the baby is born and go away within a week or two. What are the causes? The exact cause of this condition is not known. Changes in hormone levels after childbirth are believed to trigger some of the symptoms. Other factors that can play a role in these mood changes include:  Lack of sleep.  Stressful life events, such as financial problems, caring for a loved one, or death of a loved one.  Genetics. What are the signs or symptoms? Symptoms of this condition include:  Changes in mood, such as going from extreme happiness to sadness.  A decrease in concentration.  Difficulty sleeping.  Crying spells and tearfulness.  Loss of appetite.  Irritability.  Anxiety. If these symptoms last for more than 2 weeks or become more severe, you may have postpartum depression. How is this diagnosed? This condition is diagnosed based on an evaluation of your symptoms. Your health care provider may use a screening tool that includes a list of questions to help identify a person with the baby blues or postpartum depression. How is this treated? The baby blues usually go away on their own in 1-2 weeks. Social support is often what is needed. You will be encouraged to get adequate sleep and rest. Follow these instructions at home: Lifestyle  Get as much rest as you can. Take a nap when the baby sleeps.  Exercise regularly as told by your health care provider. Some women find yoga and walking to be helpful.  Eat a balanced and nourishing diet. This includes plenty of fruits and vegetables, whole grains, and lean proteins.  Do little things that you  enjoy. Take a bubble bath, read your favorite magazine, or listen to your favorite music.  Avoid alcohol.  Ask for help with household chores, cooking, grocery shopping, or running errands. Do not try to do everything yourself. Consider hiring a postpartum doula to help. This is a professional who specializes in providing support to new mothers.  Try not to make any major life changes during pregnancy or right after giving birth. This can add stress.      General instructions  Talk to people close to you about how you are feeling. Get support from your partner, family members, friends, or other new moms. You may want to join a support group.  Find ways to manage stress. This may include: ? Writing your thoughts and feelings in a journal. ? Spending time outside. ? Spending time with people who make you laugh.  Try to stay positive in how you think. Think about the things you are grateful for.  Take over-the-counter and prescription medicines only as told by your health care provider.  Let your health care provider know if you have any concerns.  Keep all postpartum visits. This is important. Contact a health care provider if:  Your baby blues do not go away after 2 weeks. Get help right away if:  You have thoughts of taking your own life (suicidal thoughts), or of harming your baby or someone else.  You see or hear things that are not there (hallucinations). If you ever feel like you   may hurt yourself or others, or have thoughts about taking your own life, get help right away. Go to your nearest emergency department or:  Call your local emergency services (911 in the [place]  Call a suicide crisis helpline, such as the National Suicide Prevention Lifeline, at 1-800-273-8255. This is open 24 hours a day in the [place]  Text the Crisis Text Line at 741741 (in the [place] Summary  After giving birth, you may feel happy one minute and sad or stressed the next. Feelings of sadness  that happen right after the baby is born and go away after a week or two are called the baby blues.  You can manage the baby blues by getting enough rest, eating a healthy diet, exercising, spending time with supportive people, and finding ways to manage stress.  If feelings of sadness and stress last longer than 2 weeks or get in the way of caring for your baby, talk with your health care provider. This may mean you have postpartum depression. This information is not intended to replace advice given to you by your health care provider. Make sure you discuss any questions you have with your health care provider. Document Revised: 01/08/2020 Document Reviewed: 01/08/2020 Elsevier Patient Education  2021 [employer]  

## 2020-09-12 ENCOUNTER — Encounter (INDEPENDENT_AMBULATORY_CARE_PROVIDER_SITE_OTHER): Payer: Medicaid Other

## 2020-09-12 ENCOUNTER — Encounter (INDEPENDENT_AMBULATORY_CARE_PROVIDER_SITE_OTHER): Payer: Medicaid Other | Admitting: Nurse Practitioner

## 2020-09-23 ENCOUNTER — Other Ambulatory Visit (INDEPENDENT_AMBULATORY_CARE_PROVIDER_SITE_OTHER): Payer: Self-pay | Admitting: Nurse Practitioner

## 2020-09-23 DIAGNOSIS — I83812 Varicose veins of left lower extremities with pain: Secondary | ICD-10-CM

## 2020-09-26 ENCOUNTER — Other Ambulatory Visit: Payer: Self-pay

## 2020-09-26 ENCOUNTER — Ambulatory Visit (INDEPENDENT_AMBULATORY_CARE_PROVIDER_SITE_OTHER): Payer: Medicaid Other

## 2020-09-26 ENCOUNTER — Ambulatory Visit (INDEPENDENT_AMBULATORY_CARE_PROVIDER_SITE_OTHER): Payer: Medicaid Other | Admitting: Nurse Practitioner

## 2020-09-26 ENCOUNTER — Encounter (INDEPENDENT_AMBULATORY_CARE_PROVIDER_SITE_OTHER): Payer: Self-pay | Admitting: Nurse Practitioner

## 2020-09-26 VITALS — BP 129/83 | HR 80 | Ht 66.0 in | Wt 139.0 lb

## 2020-09-26 DIAGNOSIS — I83812 Varicose veins of left lower extremities with pain: Secondary | ICD-10-CM

## 2020-09-26 NOTE — Progress Notes (Signed)
Subjective:    Patient ID: Monica Drake, female    DOB: [birth year], 40 y.o.   MRN: [id number] Chief Complaint  Patient presents with  . Ischemia    Monica Drake. LLE edema VV with pain Monica Drake reflux     Monica Drake is a 40 year old female seen for evaluation of symptomatic varicose veins.  The patient has had these varicosities for years.  The patient has 6 children with her most recent delivery 3 months ago.  This most recent pregnancy had the pain at its worst.  The pain extends from the patient's left medial leg that extended up through her genitals and buttocks area.  The varicosity in her genital area and buttocks have found relief since her delivery however lower extremity varicosities are not.  The patient relates burning and stinging which worsened steadily throughout the course of the day, particularly with standing. The patient also notes an aching and throbbing pain over the varicosities, particularly with prolonged dependent positions. The symptoms are significantly improved with elevation.  The patient also notes that during hot weather the symptoms are greatly intensified. The patient states the pain from the varicose veins interferes with work, daily exercise, shopping and household maintenance. At this point, the symptoms are persistent and severe enough that they're having a negative impact on lifestyle and are interfering with daily activities.  There is no history of DVT, PE or superficial thrombophlebitis. There is no history of ulceration or hemorrhage. The patient denies a significant family history of varicose veins. OB history: [id number]  The patient has worn graduated compression previously for years and continues to wear them. At the present time the patient has been using over-the-counter analgesics. There is no history of prior surgical intervention or sclerotherapy.  Noninvasive studies today show no evidence of DVT or superficial thrombophlebitis in the left  lower extremity.  There is no evidence of deep venous insufficiency.  There is evidence of reflux in the great saphenous vein extending to the proximal calf.    Review of Systems  Cardiovascular:       Painful varicosities  All other systems reviewed and are negative.      Objective:   Physical Exam Vitals reviewed.  HENT:     Head: Normocephalic.  Cardiovascular:     Rate and Rhythm: Normal rate.     Pulses: Normal pulses.     Comments: Large varicosity left lower extremity Pulmonary:     Effort: Pulmonary effort is normal.  Skin:    General: Skin is warm and dry.  Neurological:     Mental Status: She is alert and oriented to person, place, and time.  Psychiatric:        Mood and Affect: Mood normal.        Behavior: Behavior normal.        Thought Content: Thought content normal.        Judgment: Judgment normal.     BP 129/83   Pulse 80   Ht 5\' 6"  (1.676 m)   Wt 139 lb (63 kg)   BMI 22.44 kg/m   Past Medical History:  Diagnosis Date  . Foot fracture, right   . Hypoglycemia   . Iron deficiency anemia 11/21/2017  . Pregnant and not yet delivered in third trimester   . Ulcerative colitis (HCC) 11/11/2017    Social History   Socioeconomic History  . Marital status: Married    Spouse name: Not on file  . Number of children: Not on  file  . Years of education: Not on file  . Highest education level: Not on file  Occupational History  . Not on file  Tobacco Use  . Smoking status: Never Smoker  . Smokeless tobacco: Never Used  Vaping Use  . Vaping Use: Never used  Substance and Sexual Activity  . Alcohol use: No  . Drug use: No  . Sexual activity: Not Currently    Comment: vasectomy  Other Topics Concern  . Not on file  Social History Narrative  . Not on file   Social Determinants of Health   Financial Resource Strain: Not on file  Food Insecurity: Not on file  Transportation Needs: Not on file  Physical Activity: Not on file  Stress: Not on  file  Social Connections: Not on file  Intimate Partner Violence: Not on file    Past Surgical History:  Procedure Laterality Date  . ECTOPIC PREGNANCY SURGERY  02/27/2005  . FLEXIBLE SIGMOIDOSCOPY N/A 11/11/2017   Procedure: FLEXIBLE SIGMOIDOSCOPY;  Surgeon: [name], MD;  Location: [facility] ENDOSCOPY;  Service: Gastroenterology;  Laterality: N/A;  Please use tap water enema prior to procedure. Note: Patient is hypoglycemic  . FLEXIBLE SIGMOIDOSCOPY N/A 12/30/2017   Procedure: FLEXIBLE SIGMOIDOSCOPY;  Surgeon: [name], MD;  Location: [facility] ENDOSCOPY;  Service: Gastroenterology;  Laterality: N/A;  . HERNIA REPAIR      Family History  Problem Relation Age of Onset  . Diabetes Mother   . Hypertension Mother   . Arthritis Mother   . Heart disease Mother   . Hypertension Father   . Arthritis Father   . Birth defects Sister     No Known Allergies  CBC Latest Ref Rng & Units 06/25/2020 06/24/2020 04/25/2020  WBC 4.0 - 10.5 K/uL 12.6(H) 9.2 7.8  Hemoglobin 12.0 - 15.0 g/dL 10.1(L) 10.9(L) 12.2  Hematocrit 36.0 - 46.0 % 31.2(L) 33.2(L) 36.0  Platelets 150 - 400 K/uL 225 249 293      CMP     Component Value Date/Time   NA 138 11/05/2017 1006   K 4.1 11/05/2017 1006   CL 104 11/05/2017 1006   CO2 21 11/05/2017 1006   GLUCOSE 72 11/05/2017 1006   BUN 7 11/05/2017 1006   CREATININE 0.51 (L) 11/05/2017 1006   CALCIUM 8.7 11/05/2017 1006   PROT 5.8 (L) 11/05/2017 1006   ALBUMIN 3.3 (L) 11/05/2017 1006   AST 19 11/05/2017 1006   ALT 12 11/05/2017 1006   ALKPHOS 107 11/05/2017 1006   BILITOT <0.2 11/05/2017 1006   GFRNONAA 126 11/05/2017 1006   GFRAA 145 11/05/2017 1006     No results found.     Assessment & Plan:   1. Varicose veins of left lower extremity with pain Recommend  I have reviewed my  discussion with the patient regarding  varicose veins and why they cause symptoms. Patient will continue  wearing graduated compression stockings class 1 on a daily basis,  beginning first thing in the morning and removing them in the evening.    In addition, behavioral modification including elevation during the day was again discussed and this will continue.  The patient has utilized over the counter pain medications and has been exercising.  However, at this time conservative therapy has not alleviated the patient's symptoms of leg pain and swelling  Recommend: laser ablation of the  left great saphenous veins to eliminate the symptoms of pain and swelling of the lower extremities caused by the severe superficial venous reflux disease.  Currently the patient is currently breast-feeding.  In order to avoid possible issues due to medications for procedure, we will wait until her son has weaned.  The patient will contact our office when she is ready to schedule and we will proceed with next steps. Current Outpatient Medications on File Prior to Visit  Medication Sig Dispense Refill  . Cholecalciferol 250 MCG (10000 UT) CAPS Take by mouth.    . polyethylene glycol powder (GLYCOLAX/MIRALAX) 17 GM/SCOOP powder     . prenatal vitamin w/FE, FA (NATACHEW) 29-1 MG CHEW chewable tablet Chew 1 tablet by mouth daily at 12 noon.    Marland Kitchen acetaminophen (TYLENOL) 325 MG tablet Take 2 tablets (650 mg total) by mouth every 4 (four) hours as needed for mild pain or moderate pain (for pain scale < 4). (Patient not taking: No sig reported) 60 tablet 1  . ferrous sulfate 325 (65 FE) MG EC tablet Take 1 tablet (325 mg total) by mouth 2 (two) times daily with a meal. (Patient not taking: No sig reported) 60 tablet 3  . mesalamine (ROWASA) 4 g enema Place rectally. (Patient not taking: No sig reported)     No current facility-administered medications on file prior to visit.    There are no Patient Instructions on file for this visit. No follow-ups on file.   Georgiana Spinner, NP

## 2021-12-07 ENCOUNTER — Encounter: Payer: Self-pay | Admitting: Obstetrics and Gynecology

## 2021-12-07 ENCOUNTER — Ambulatory Visit: Payer: Medicaid Other | Admitting: Obstetrics and Gynecology

## 2021-12-07 VITALS — BP 96/70 | Ht 66.0 in | Wt 132.0 lb

## 2021-12-07 DIAGNOSIS — R1032 Left lower quadrant pain: Secondary | ICD-10-CM | POA: Diagnosis not present

## 2021-12-07 NOTE — Progress Notes (Signed)
? ? ?[name], MD (Inactive) ? ? ?Chief Complaint  ?Patient presents with  ? Pelvic Pain  ?  Left side only x 2 days, UPT neg done today  ? ? ?HPI: ?     [name] is a 41 y.o. [id number] whose LMP was Patient's last menstrual period was 11/16/2021 (approximate)., presents today for LLQ pain past 2 days. Sx started after moving/lifting boxes and was sharp and stabbing, pt couldn't stand upright. Sx improved next day but then started again after sex last night. No pain during sex, but was sharp and stabbing afterwards, more throbbing/achy today. Sx intermittent now. Hasn't taken any meds for pain/no heating pad. Hx of ovar cysts in past, often rupture after a day. Also has hx of UC but sx not similar. No urin sx.  ?Menses are monthly, lasting 5 days, mod flow, no BTB, mild dysmen. Had neg UPT today; period due in next day or so. Pt is breastfeeding, no birth control. Hx of ectopic pregnancy in past.  ?Neg pap 12/18 ? ?Patient Active Problem List  ? Diagnosis Date Noted  ? Postpartum care following vaginal delivery 06/25/2020  ? Normal labor and delivery 06/25/2020  ? [redacted] weeks gestation of pregnancy 06/24/2020  ? GBS bacteriuria 04/25/2020  ? Antepartum multigravida of advanced maternal age 33/27/2021  ? Iron deficiency anemia 11/21/2017  ? Ulcerative pancolitis with rectal bleeding ([place]) 11/11/2017  ? Right renal mass 11/04/2017  ? Anemia of pregnancy 10/07/2017  ? 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 Grade I hemorrhoids 07/15/2017  ? High-risk pregnancy 06/21/2015  ? ? ?Past Surgical History:  ?Procedure Laterality Date  ? ECTOPIC PREGNANCY SURGERY  02/27/2005  ? FLEXIBLE SIGMOIDOSCOPY N/A 11/11/2017  ? Procedure: FLEXIBLE SIGMOIDOSCOPY;  Surgeon: Jonathon Bellows, MD;  Location: [facility] ENDOSCOPY;  Service: Gastroenterology;  Laterality: N/A;  Please use tap water enema prior to procedure. ?Note: Patient is hypoglycemic  ? FLEXIBLE SIGMOIDOSCOPY N/A 12/30/2017  ? Procedure:  FLEXIBLE SIGMOIDOSCOPY;  Surgeon: Jonathon Bellows, MD;  Location: [facility] ENDOSCOPY;  Service: Gastroenterology;  Laterality: N/A;  ? HERNIA REPAIR    ? ? ?Family History  ?Problem Relation Age of Onset  ? Diabetes Mother   ? Hypertension Mother   ? Arthritis Mother   ? Heart disease Mother   ? Hypertension Father   ? Arthritis Father   ? Birth defects Sister   ? ? ?Social History  ? ?Socioeconomic History  ? Marital status: Married  ?  Spouse name: Not on file  ? Number of children: Not on file  ? Years of education: Not on file  ? Highest education level: Not on file  ?Occupational History  ? Not on file  ?Tobacco Use  ? Smoking status: Never  ? Smokeless tobacco: Never  ?Vaping Use  ? Vaping Use: Never used  ?Substance and Sexual Activity  ? Alcohol use: No  ? Drug use: No  ? Sexual activity: Yes  ?  Birth control/protection: None, Condom  ?Other Topics Concern  ? Not on file  ?Social History Narrative  ? Not on file  ? ?Social Determinants of Health  ? ?Financial Resource Strain: Not on file  ?Food Insecurity: Not on file  ?Transportation Needs: Not on file  ?Physical Activity: Not on file  ?Stress: Not on file  ?Social Connections: Not on file  ?Intimate Partner Violence: Not on file  ? ? ?Outpatient Medications Prior to Visit  ?Medication Sig Dispense Refill  ? Cholecalciferol 250 MCG (  10000 UT) CAPS Take by mouth.    ? ferrous sulfate 325 (65 FE) MG EC tablet Take 1 tablet (325 mg total) by mouth 2 (two) times daily with a meal. 60 tablet 3  ? polyethylene glycol powder (GLYCOLAX/MIRALAX) 17 GM/SCOOP powder     ? prenatal vitamin w/FE, FA (NATACHEW) 29-1 MG CHEW chewable tablet Chew 1 tablet by mouth daily at 12 noon.    ? acetaminophen (TYLENOL) 325 MG tablet Take 2 tablets (650 mg total) by mouth every 4 (four) hours as needed for mild pain or moderate pain (for pain scale < 4). (Patient not taking: No sig reported) 60 tablet 1  ? mesalamine (ROWASA) 4 g enema Place rectally. (Patient not taking: No sig reported)     ? ?No facility-administered medications prior to visit.  ? ? ? ? ?ROS: ? ?Review of Systems  ?Constitutional:  Negative for fever.  ?Gastrointestinal:  Positive for constipation. Negative for blood in stool, diarrhea, nausea and vomiting.  ?Genitourinary:  Positive for pelvic pain. Negative for dyspareunia, dysuria, flank pain, frequency, hematuria, urgency, vaginal bleeding, vaginal discharge and vaginal pain.  ?Musculoskeletal:  Negative for back pain.  ?Skin:  Negative for rash.  ?BREAST: No symptoms ? ? ?OBJECTIVE:  ? ?Vitals:  ?BP 96/70   Ht 5\' 6"  (1.676 m)   Wt 132 lb (59.9 kg)   LMP 11/16/2021 (Approximate)   Breastfeeding Yes   BMI 21.31 kg/m?  ? ?Physical Exam ?Vitals reviewed.  ?Constitutional:   ?   Appearance: She is well-developed.  ?Pulmonary:  ?   Effort: Pulmonary effort is normal.  ?Abdominal:  ?   Palpations: Abdomen is soft.  ?   Tenderness: There is abdominal tenderness in the left lower quadrant. There is no guarding or rebound.  ?Genitourinary: ?   General: Normal vulva.  ?   Pubic Area: No rash.   ?   Labia:     ?   Right: No rash, tenderness or lesion.     ?   Left: No rash, tenderness or lesion.   ?   Vagina: Normal. No vaginal discharge, erythema or tenderness.  ?   Cervix: Normal.  ?   Uterus: Normal. Tender. Not enlarged.   ?   Adnexa: Right adnexa normal and left adnexa normal.    ?   Right: No mass or tenderness.      ?   Left: No mass or tenderness.    ?Musculoskeletal:     ?   General: Normal range of motion.  ?   Cervical back: Normal range of motion.  ?Skin: ?   General: Skin is warm and dry.  ?Neurological:  ?   General: No focal deficit present.  ?   Mental Status: She is alert and oriented to person, place, and time.  ?Psychiatric:     ?   Mood and Affect: Mood normal.     ?   Behavior: Behavior normal.     ?   Thought Content: Thought content normal.     ?   Judgment: Judgment normal.  ? ? ?Assessment/Plan: ?LLQ pain--for 2 days, sx started after lifting boxes. Neg UPT  today. Hx of ovar cysts. Tender on exam but sx stable today. Discussed could be MSK vs ovar cyst. NSAIDs/heating pad. If sx persist/worsen will check GYN u/s. Sx also may improve with menses in a days or so. F/u prn.  ? ? ? Return if symptoms worsen or fail to improve. ? ?Gifford Ballon B. Emmajean Ratledge,  PA-C ?12/07/2021 ?3:38 PM ? ? ? ? ? ?

## 2022-05-28 ENCOUNTER — Encounter (INDEPENDENT_AMBULATORY_CARE_PROVIDER_SITE_OTHER): Payer: Self-pay
